# Patient Record
Sex: Female | Born: 1945 | Race: White | Hispanic: No | Marital: Single | Smoking: Former smoker
Health system: Southern US, Community
[De-identification: ages and names within clinical notes are randomized; demographics above are authoritative.]

## PROBLEM LIST (undated history)

## (undated) DIAGNOSIS — Z8489 Family history of other specified conditions: Secondary | ICD-10-CM

## (undated) DIAGNOSIS — J189 Pneumonia, unspecified organism: Secondary | ICD-10-CM

## (undated) DIAGNOSIS — J449 Chronic obstructive pulmonary disease, unspecified: Secondary | ICD-10-CM

## (undated) DIAGNOSIS — E7800 Pure hypercholesterolemia, unspecified: Secondary | ICD-10-CM

## (undated) DIAGNOSIS — R7303 Prediabetes: Secondary | ICD-10-CM

## (undated) DIAGNOSIS — M810 Age-related osteoporosis without current pathological fracture: Secondary | ICD-10-CM

## (undated) DIAGNOSIS — I700 Atherosclerosis of aorta: Secondary | ICD-10-CM

## (undated) HISTORY — DX: Chronic obstructive pulmonary disease, unspecified: J44.9

## (undated) HISTORY — PX: ABDOMINAL HYSTERECTOMY: SHX81

## (undated) HISTORY — PX: COLONOSCOPY: SHX174

## (undated) HISTORY — DX: Age-related osteoporosis without current pathological fracture: M81.0

---

## 1974-01-28 HISTORY — PX: ABDOMINAL HYSTERECTOMY: SHX81

## 2003-01-29 HISTORY — PX: OTHER SURGICAL HISTORY: SHX169

## 2004-07-09 ENCOUNTER — Ambulatory Visit: Payer: Self-pay | Admitting: General Practice

## 2004-12-12 ENCOUNTER — Ambulatory Visit: Payer: Self-pay | Admitting: Endocrinology

## 2005-02-21 ENCOUNTER — Ambulatory Visit: Payer: Self-pay | Admitting: Family Medicine

## 2005-07-09 ENCOUNTER — Ambulatory Visit: Payer: Self-pay | Admitting: General Practice

## 2005-11-01 ENCOUNTER — Ambulatory Visit: Payer: Self-pay | Admitting: Unknown Physician Specialty

## 2006-07-15 ENCOUNTER — Ambulatory Visit: Payer: Self-pay | Admitting: Endocrinology

## 2007-02-02 ENCOUNTER — Other Ambulatory Visit: Payer: Self-pay

## 2007-02-02 ENCOUNTER — Ambulatory Visit: Payer: Self-pay | Admitting: Internal Medicine

## 2007-09-09 ENCOUNTER — Ambulatory Visit: Payer: Self-pay | Admitting: Endocrinology

## 2009-01-28 HISTORY — PX: ORIF FOREARM FRACTURE: SHX2124

## 2011-01-29 LAB — COMPREHENSIVE METABOLIC PANEL
Albumin: 3.8 g/dL (ref 3.4–5.0)
Alkaline Phosphatase: 60 U/L (ref 50–136)
Anion Gap: 10 (ref 7–16)
BUN: 12 mg/dL (ref 7–18)
Bilirubin,Total: 0.3 mg/dL (ref 0.2–1.0)
Calcium, Total: 9.1 mg/dL (ref 8.5–10.1)
Co2: 29 mmol/L (ref 21–32)
Creatinine: 0.89 mg/dL (ref 0.60–1.30)
EGFR (African American): >60
Glucose: 108 mg/dL — ABNORMAL HIGH (ref 65–99)
Osmolality: 283 (ref 275–301)
Potassium: 4 mmol/L (ref 3.5–5.1)
Sodium: 142 mmol/L (ref 136–145)
Total Protein: 7.7 g/dL (ref 6.4–8.2)

## 2011-01-29 LAB — CBC
HGB: 14.8 g/dL (ref 12.0–16.0)
RBC: 4.61 10*6/uL (ref 3.80–5.20)
WBC: 6.8 10*3/uL (ref 3.6–11.0)

## 2011-01-30 ENCOUNTER — Inpatient Hospital Stay: Payer: Self-pay | Admitting: Internal Medicine

## 2011-01-30 LAB — RAPID INFLUENZA A&B ANTIGENS

## 2011-01-31 LAB — CBC WITH DIFFERENTIAL/PLATELET
Basophil #: 0 10*3/uL (ref 0.0–0.1)
Basophil %: 0.1 %
Eosinophil #: 0 10*3/uL (ref 0.0–0.7)
Eosinophil %: 0 %
HCT: 36.6 % (ref 35.0–47.0)
Lymphocyte %: 7.3 %
MCH: 31.6 pg (ref 26.0–34.0)
MCHC: 33.2 g/dL (ref 32.0–36.0)
MCV: 95 fL (ref 80–100)
Monocyte %: 6.2 %
Neutrophil #: 11.8 10*3/uL — ABNORMAL HIGH (ref 1.4–6.5)
Neutrophil %: 86.4 %
Platelet: 204 10*3/uL (ref 150–440)
RDW: 12.6 % (ref 11.5–14.5)
WBC: 13.6 10*3/uL — ABNORMAL HIGH (ref 3.6–11.0)

## 2011-01-31 LAB — BASIC METABOLIC PANEL
Anion Gap: 9 (ref 7–16)
BUN: 15 mg/dL (ref 7–18)
Chloride: 109 mmol/L — ABNORMAL HIGH (ref 98–107)
Creatinine: 0.64 mg/dL (ref 0.60–1.30)
EGFR (African American): >60
EGFR (Non-African Amer.): >60
Osmolality: 293 (ref 275–301)
Potassium: 4 mmol/L (ref 3.5–5.1)

## 2013-06-17 ENCOUNTER — Ambulatory Visit: Payer: Self-pay | Admitting: Emergency Medicine

## 2013-06-28 DIAGNOSIS — B370 Candidal stomatitis: Secondary | ICD-10-CM | POA: Insufficient documentation

## 2013-06-28 DIAGNOSIS — G479 Sleep disorder, unspecified: Secondary | ICD-10-CM | POA: Insufficient documentation

## 2013-12-10 ENCOUNTER — Ambulatory Visit: Payer: Self-pay

## 2014-04-18 DIAGNOSIS — M179 Osteoarthritis of knee, unspecified: Secondary | ICD-10-CM | POA: Diagnosis not present

## 2014-04-18 DIAGNOSIS — Z9289 Personal history of other medical treatment: Secondary | ICD-10-CM | POA: Diagnosis not present

## 2014-04-18 DIAGNOSIS — Z8051 Family history of malignant neoplasm of kidney: Secondary | ICD-10-CM | POA: Diagnosis not present

## 2014-04-18 DIAGNOSIS — Z23 Encounter for immunization: Secondary | ICD-10-CM | POA: Diagnosis not present

## 2014-04-18 DIAGNOSIS — Z8249 Family history of ischemic heart disease and other diseases of the circulatory system: Secondary | ICD-10-CM | POA: Diagnosis not present

## 2014-04-18 DIAGNOSIS — J449 Chronic obstructive pulmonary disease, unspecified: Secondary | ICD-10-CM | POA: Diagnosis not present

## 2014-05-12 ENCOUNTER — Encounter: Payer: Self-pay | Admitting: Family Medicine

## 2014-05-12 DIAGNOSIS — M1710 Unilateral primary osteoarthritis, unspecified knee: Secondary | ICD-10-CM | POA: Insufficient documentation

## 2014-05-12 DIAGNOSIS — Z8249 Family history of ischemic heart disease and other diseases of the circulatory system: Secondary | ICD-10-CM | POA: Insufficient documentation

## 2014-05-12 DIAGNOSIS — Z8051 Family history of malignant neoplasm of kidney: Secondary | ICD-10-CM | POA: Insufficient documentation

## 2014-05-12 DIAGNOSIS — M179 Osteoarthritis of knee, unspecified: Secondary | ICD-10-CM | POA: Insufficient documentation

## 2014-05-12 DIAGNOSIS — Z9889 Other specified postprocedural states: Secondary | ICD-10-CM | POA: Insufficient documentation

## 2014-05-12 DIAGNOSIS — J449 Chronic obstructive pulmonary disease, unspecified: Secondary | ICD-10-CM | POA: Insufficient documentation

## 2014-05-22 NOTE — Discharge Summary (Signed)
PATIENT NAME:  Lori Lambert, Lori Lambert MR#:  [id number] DATE OF BIRTH:  1945/07/04  DATE OF ADMISSION:  01/30/2011 DATE OF DISCHARGE:  02/01/2011  DIAGNOSES: 1. Possible chronic obstructive pulmonary disease. 2. Bronchitis. 3. Leukocytosis.  4. Hyperglycemia.  5. Smoking.   DISPOSITION: Patient is being discharged home.   FOLLOW UP: Follow up with primary care physician in 1 to 2 weeks after discharge.   DIET: Regular.   ACTIVITY: As tolerated.   NOTE: Patient is going to need outpatient pulmonary function testing.   DISCHARGE MEDICATIONS:  1. Symbicort 160/4.5, 2 puffs b.i.d.  2. Spiriva 18 mcg inhaled daily.  3. Tussionex 5 mL b.i.d. p.r.n. cough.  4. Levaquin 500 mg daily for five days.  5. Combivent metered dose inhaler 2 puffs q.6 hours p.r.n. shortness of breath. 6. Nicotine patch 21 mg daily.   LABORATORY, DIAGNOSTIC, AND RADIOLOGICAL DATA: Influenza A and B negative. Chest x-ray showed no evidence of any acute infiltrate or any cardiopulmonary pathology. CBC essentially normal. Complete metabolic panel normal other than mild hyperglycemia and hyponatremia.   HOSPITAL COURSE: Patient is a 69 year old female with past medical history of smoking who presented with upper respiratory infection symptoms, cough, shortness of breath. She was felt to have possible chronic obstructive pulmonary disease with bronchitis and was treated with nebulizer treatments, Symbicort, Spiriva and empiric antibiotics. Influenza A and B were checked which were negative. She had no infiltrate on her chest x-ray. With conservative management there was an improvement in patient's condition. She will require pulmonary function test as an outpatient. She had mild leukocytosis and this was felt to be due to steroids that she had received initially. She was counseled extensively about smoking. She is being discharged home in a stable condition and advised to follow up with her primary care physician in 1 to 2 weeks  after discharge and have outpatient PFTs.   TIME SPENT: 45 minutes.  ____________________________ Darrick MeigsSangeeta Tarsha Blando, MD sp:cms D: 02/01/2011 16:34:20 ET T: 02/04/2011 10:34:27 ET JOB#: [id number]  cc: Darrick MeigsSangeeta Tamie Minteer, MD, <Dictator> Darrick MeigsSANGEETA Smaran Gaus MD ELECTRONICALLY SIGNED 02/04/2011 12:16

## 2014-05-22 NOTE — H&P (Signed)
PATIENT NAME:  Lori Lambert, Lori Lambert MR#:  [id number] DATE OF BIRTH:  [birth year]  DATE OF ADMISSION:  01/30/2011  PRIMARY CARE PHYSICIAN: Dr. Mayford Knife    EMERGENCY ROOM PHYSICIAN: Dr. Dolores Frame   CHIEF COMPLAINT: Shortness of breath.   HISTORY OF PRESENT ILLNESS: The patient is a 69 year old female who presents with chief complaint of shortness of breath. Symptoms began three days ago. The patient has had associated wheezing, dry cough, low-grade fever. The patient felt like she was having the flu. The patient has a granddaughter who is also sick at home. The patient saw her primary care physician three days ago. She was put on ibuprofen and Zithromax. She has not had much relief.   ALLERGIES: Demerol and sulfa.   CURRENT MEDICATIONS:  1. Zithromax 250 mg p.o. daily. 2. Ibuprofen 200 mg p.o. q.4 hours.   PAST SURGICAL HISTORY: Colonoscopy.   SOCIAL HISTORY: The patient smokes 1 pack per day. She denies alcohol abuse or drug abuse. She is employed in Location manager for building boxes.    FAMILY HISTORY: The patient's father died at 76 years old, had an MI. Mother died in her 29's, had an MI.   REVIEW OF SYSTEMS: CONSTITUTIONAL: The patient denies any fevers, chills, night sweats. HEENT: The patient denies any hearing loss, dysphagia, visual problems. CARDIOVASCULAR: The patient denies any chest pain, orthopnea, or PND. RESPIRATORY: Please see history of present illness. GU: The patient denies any nausea, vomiting, abdominal pain, hematemesis, hematochezia, or melena. GU: The patient denies any hematuria, dysuria, or frequency. NEUROLOGIC: The patient denies any headache, focal weakness or seizures. SKIN: The patient denies lesions or rash. ENDOCRINE: The patient denies any polyuria, polyphagia, or polydipsia. MUSCULOSKELETAL: The patient denies any arthritis, joint effusion, or swelling. HEMATOLOGICAL: The patient denies any easy bleeding or bruises.   PHYSICAL EXAMINATION:   VITAL SIGNS:  Temperature 98.4, heart rate 91, respirations 22, blood pressure 105/71, oxygen sat 91%.   HEENT: Atraumatic, normocephalic. Pupils equal, round, and reactive to light and accommodation. Extraocular movements intact. Sclerae anicteric. Mucous membranes dry.   NECK: Supple. No organomegaly.   CARDIOVASCULAR: S1, S2, regular rate and rhythm. No gallops, no thrills, no murmurs.   RESPIRATORY: The patient has expiratory wheezes bilaterally. There is no rales or rhonchi.   GI: Abdomen is soft, nontender, nondistended. Normal bowel sounds. No hepatosplenomegaly.   GU: There is no hematuria or masses noted.   SKIN: No lesions, no rash.   ENDOCRINE: There are no masses. No thyromegaly.   LYMPH: No lymphadenopathy or nodes palpable.   NEUROLOGIC: Cranial nerves II through XII grossly intact. Motor strength is 5 out of 5 in bilateral upper and lower extremities. Sensation is within normal limits. No focal neurological deficits noted on examination.   MUSCULOSKELETAL: No arthritis, joint effusion, or swelling.   HEMATOLOGICAL: No ecchymosis, no bleeding, no petechiae noted.   EXTREMITIES: No cyanosis, no clubbing, no edema. 2+ pedal pulses bilaterally.   LABORATORY, DIAGNOSTIC, AND RADIOLOGICAL DATA: WBC count 6800, hemoglobin 14.6, hematocrit 43.7, platelet count 216, glucose 108, BUN 12, creatinine 0.89, sodium 142, potassium 4.0, chloride 103, CO2 29, calcium 9.1, total bilirubin 0.3, alkaline phosphatase 60, ALT 27, AST 29, total protein 7.7, albumin 3.8, estimated GFR is greater than 60. EKG sinus tachycardia, premature atrial complexes, 104 beats per minute.   ASSESSMENT AND PLAN:  1. The patient is a 69 year old female who presents with shortness of breath and wheezing due to COPD exacerbation. Admit to medical floor. Start COPD clinical  pathway orders. IV Solu-Medrol, nebs, Rocephin, and IV fluids.  2. Tobacco abuse. Smoking cessation. Place nicotine patch.  3. DVT prophylaxis. Lovenox.    ____________________________ Donia AstJignesh S. Kazimir Hartnett, MD jsp:drc D: 01/30/2011 03:22:30 ET T: 01/30/2011 10:00:22 ET JOB#: [id number]  cc: Donia AstJignesh S. Yaniel Limbaugh, MD, <Dictator> Donia AstJIGNESH S Jewell Haught MD ELECTRONICALLY SIGNED 01/30/2011 21:47

## 2014-05-23 DIAGNOSIS — F17200 Nicotine dependence, unspecified, uncomplicated: Secondary | ICD-10-CM | POA: Diagnosis not present

## 2014-05-23 DIAGNOSIS — Z23 Encounter for immunization: Secondary | ICD-10-CM | POA: Diagnosis not present

## 2014-05-23 DIAGNOSIS — J439 Emphysema, unspecified: Secondary | ICD-10-CM | POA: Diagnosis not present

## 2014-05-23 DIAGNOSIS — M40204 Unspecified kyphosis, thoracic region: Secondary | ICD-10-CM | POA: Diagnosis not present

## 2014-05-23 DIAGNOSIS — M179 Osteoarthritis of knee, unspecified: Secondary | ICD-10-CM | POA: Diagnosis not present

## 2015-12-13 ENCOUNTER — Ambulatory Visit: Payer: Self-pay | Admitting: Internal Medicine

## 2016-01-01 ENCOUNTER — Encounter: Payer: Self-pay | Admitting: Family Medicine

## 2016-01-01 ENCOUNTER — Ambulatory Visit (INDEPENDENT_AMBULATORY_CARE_PROVIDER_SITE_OTHER): Payer: BLUE CROSS/BLUE SHIELD | Admitting: Family Medicine

## 2016-01-01 VITALS — BP 128/78 | HR 73 | Resp 16 | Ht 62.0 in | Wt 121.0 lb

## 2016-01-01 DIAGNOSIS — J449 Chronic obstructive pulmonary disease, unspecified: Secondary | ICD-10-CM

## 2016-01-01 DIAGNOSIS — M170 Bilateral primary osteoarthritis of knee: Secondary | ICD-10-CM

## 2016-01-01 MED ORDER — BUDESONIDE-FORMOTEROL FUMARATE 160-4.5 MCG/ACT IN AERO: 2.0000 | INHALATION_SPRAY | Freq: Two times a day (BID) | RESPIRATORY_TRACT | 5 refills | Status: DC

## 2016-01-02 NOTE — Progress Notes (Signed)
Date:  01/01/2016   Name:  Lori Lambert   DOB:  01/09/1946   MRN:  [id number]  PCP:  Schuyler AmorWilliam Orlen Leedy, MD    Chief Complaint: COPD (refill symbicort )   History of Present Illness:  This is a 70 y.o. female with COPD here for Symbicort refill, uses one puff bid, also has Ventolin MDI but uses rarely. Some knee OA but otherwise healthy. No longer smoking, COPD sxs stable.  Review of Systems:  Review of Systems  Constitutional: Negative for chills and fever.  Respiratory: Negative for shortness of breath.   Cardiovascular: Negative for chest pain and leg swelling.    Patient Active Problem List   Diagnosis Date Noted  . CAFL (chronic airflow limitation) (HCC) 05/12/2014  . Family history of cancer of the kidney 05/12/2014  . Family history of cardiac disorder 05/12/2014  . H/O surgical procedure 05/12/2014  . Arthritis of knee, degenerative 05/12/2014    Prior to Admission medications   Medication Sig Start Date End Date Taking? Authorizing Provider  albuterol (PROVENTIL HFA;VENTOLIN HFA) 108 (90 BASE) MCG/ACT inhaler Inhale 2 puffs into the lungs every 4 (four) hours as needed.   Yes Historical Provider, MD  budesonide-formoterol (SYMBICORT) 160-4.5 MCG/ACT inhaler Inhale 2 puffs into the lungs 2 (two) times daily. 01/01/16  Yes Schuyler AmorWilliam Esaul Dorwart, MD    Allergies  Allergen Reactions  . Sulfa Antibiotics     History reviewed. No pertinent surgical history.  Social History  Substance Use Topics  . Smoking status: Former Games developermoker  . Smokeless tobacco: Never Used  . Alcohol use No    Family History  Problem Relation Age of Onset  . Heart attack Mother   . Heart attack Father   . Cancer Sister     Medication list has been reviewed and updated.  Physical Examination: BP 128/78   Pulse 73   Resp 16   Ht 5\' 2"  (1.575 m)   Wt 121 lb (54.9 kg)   SpO2 98%   BMI 22.13 kg/m   Physical Exam  Constitutional: She is oriented to person, place, and time. She appears  well-developed and well-nourished.  Cardiovascular: Normal rate, regular rhythm and normal heart sounds.   Pulmonary/Chest: Effort normal and breath sounds normal.  Musculoskeletal: She exhibits no edema.  Neurological: She is alert and oriented to person, place, and time.  Skin: Skin is warm and dry.  Psychiatric: She has a normal mood and affect. Her behavior is normal.  Nursing note and vitals reviewed.   Assessment and Plan:  1. Chronic obstructive pulmonary disease, unspecified COPD type (HCC) Stable, refill Symbicort, schedule wellness visit  2. Primary osteoarthritis of both knees Tylenol prn   Return in about 3 months (around 03/31/2016).  Dionne AnoWilliam M. Kingsley SpittlePlonk, Jr. MD [facility]  01/02/2016

## 2016-02-21 ENCOUNTER — Ambulatory Visit (INDEPENDENT_AMBULATORY_CARE_PROVIDER_SITE_OTHER): Payer: BLUE CROSS/BLUE SHIELD | Admitting: Family Medicine

## 2016-02-21 ENCOUNTER — Encounter: Payer: Self-pay | Admitting: Family Medicine

## 2016-02-21 VITALS — BP 108/78 | HR 67 | Temp 97.7°F | Resp 16 | Ht 62.0 in | Wt 128.8 lb

## 2016-02-21 DIAGNOSIS — Z23 Encounter for immunization: Secondary | ICD-10-CM | POA: Diagnosis not present

## 2016-02-21 DIAGNOSIS — Z8249 Family history of ischemic heart disease and other diseases of the circulatory system: Secondary | ICD-10-CM

## 2016-02-21 DIAGNOSIS — J449 Chronic obstructive pulmonary disease, unspecified: Secondary | ICD-10-CM | POA: Diagnosis not present

## 2016-02-21 DIAGNOSIS — J40 Bronchitis, not specified as acute or chronic: Secondary | ICD-10-CM

## 2016-02-21 DIAGNOSIS — Z0000 Encounter for general adult medical examination without abnormal findings: Secondary | ICD-10-CM | POA: Diagnosis not present

## 2016-02-21 DIAGNOSIS — Z1231 Encounter for screening mammogram for malignant neoplasm of breast: Secondary | ICD-10-CM

## 2016-02-21 DIAGNOSIS — Z8051 Family history of malignant neoplasm of kidney: Secondary | ICD-10-CM

## 2016-02-21 DIAGNOSIS — M170 Bilateral primary osteoarthritis of knee: Secondary | ICD-10-CM

## 2016-02-21 DIAGNOSIS — Z1239 Encounter for other screening for malignant neoplasm of breast: Secondary | ICD-10-CM

## 2016-02-21 MED ORDER — AZITHROMYCIN 250 MG PO TABS: ORAL_TABLET | ORAL | 0 refills | Status: DC

## 2016-02-21 MED ORDER — ACETAMINOPHEN 500 MG PO TABS: 500.0000 mg | ORAL_TABLET | Freq: Four times a day (QID) | ORAL | 0 refills | Status: DC | PRN

## 2016-02-21 MED ORDER — ZOSTER VACCINE LIVE 19400 UNT/0.65ML ~~LOC~~ SUSR: 0.6500 mL | Freq: Once | SUBCUTANEOUS | 0 refills | Status: AC

## 2016-02-21 NOTE — Patient Instructions (Signed)
Avoid Aleve, try Tylenol extra strength instead.

## 2016-02-21 NOTE — Progress Notes (Signed)
Date:  02/21/2016   Name:  Lori Lambert   DOB:  1945/06/17   MRN:  [id number]  PCP:  Schuyler Amor, MD    Chief Complaint: Annual Exam and Cough (hx breathing problems may need rx to catch early. started Sat felt dizzy all day and then went into cough Monday. Causes difficult breathing at times. )   History of Present Illness:  This is a 71 y.o. female seen in six week f/u. 4 days ago developed dizziness/fever then cough prod clear phlegm, fever resolved but cough getting worse, having more wheezing, rhinorrhea, Delsym qhs not helping. Using Symbicort bid, sometimes more, seldom uses albuterol. Taking Aleve prn for OA pain, helps. Father, mother, and brother died MI, sister died kidney ca. No smoking x 3 yrs. Has had flu imm and Pneumovax but not Prevnar or Zostrix. Tdap last year. Mammo last 2014, colonoscopy 5 yrs ago no sign findings per pt  Review of Systems:  Review of Systems  Constitutional: Negative for chills and fatigue.  HENT: Negative for ear pain and sore throat.   Eyes: Negative for pain.  Respiratory: Negative for choking and stridor.   Cardiovascular: Negative for chest pain and leg swelling.  Gastrointestinal: Negative for abdominal pain, constipation and diarrhea.  Endocrine: Negative for polydipsia and polyuria.  Genitourinary: Negative for dysuria.  Musculoskeletal: Negative for myalgias.  Neurological: Negative for tremors, syncope and light-headedness.  Hematological: Negative for adenopathy.  Psychiatric/Behavioral: Negative for agitation and confusion.    Patient Active Problem List   Diagnosis Date Noted  . CAFL (chronic airflow limitation) (HCC) 05/12/2014  . Family history of cancer of the kidney 05/12/2014  . Family history of cardiac disorder 05/12/2014  . H/O surgical procedure 05/12/2014  . Arthritis of knee, degenerative 05/12/2014    Prior to Admission medications   Medication Sig Start Date End Date Taking? Authorizing Provider  albuterol  (PROVENTIL HFA;VENTOLIN HFA) 108 (90 BASE) MCG/ACT inhaler Inhale 2 puffs into the lungs every 4 (four) hours as needed.   Yes Historical Provider, MD  budesonide-formoterol (SYMBICORT) 160-4.5 MCG/ACT inhaler Inhale 2 puffs into the lungs 2 (two) times daily. 01/01/16  Yes Schuyler Amor, MD  azithromycin (ZITHROMAX) 250 MG tablet Take two tablets today then one tablet daily for four days 02/21/16   Schuyler Amor, MD  Zoster Vaccine Live, PF, (ZOSTAVAX) 78295 UNT/0.65ML injection Inject 19,400 Units into the skin once. 02/21/16 02/21/16  Schuyler Amor, MD    Allergies  Allergen Reactions  . Sulfa Antibiotics     History reviewed. No pertinent surgical history.  Social History  Substance Use Topics  . Smoking status: Former Games developer  . Smokeless tobacco: Never Used  . Alcohol use No    Family History  Problem Relation Age of Onset  . Heart attack Mother   . Heart attack Father   . Cancer Sister     Medication list has been reviewed and updated.  Physical Examination: BP 108/78   Pulse 67   Temp 97.7 F (36.5 C) (Other (Comment))   Resp 16   Ht 5\' 2"  (1.575 m)   Wt 128 lb 12.8 oz (58.4 kg)   SpO2 95%   BMI 23.56 kg/m   Physical Exam  Constitutional: She is oriented to person, place, and time. She appears well-developed and well-nourished.  HENT:  Head: Normocephalic and atraumatic.  Right Ear: External ear normal.  Left Ear: External ear normal.  Nose: Nose normal.  Mouth/Throat: Oropharynx is clear and moist.  TM's clear  Eyes: Conjunctivae and EOM are normal. Pupils are equal, round, and reactive to light. No scleral icterus.  Neck: Neck supple. No thyromegaly present.  Cardiovascular: Normal rate, regular rhythm, normal heart sounds and intact distal pulses.   Pulmonary/Chest: Effort normal. She has no rales.  Exp wheezes RUL  Abdominal: Soft. She exhibits no distension and no mass. There is no tenderness.  Musculoskeletal: She exhibits no edema.  Lymphadenopathy:     She has no cervical adenopathy.  Neurological: She is alert and oriented to person, place, and time. Coordination normal.  Romberg negative, gait normal  Skin: Skin is warm and dry.  Psychiatric: She has a normal mood and affect. Her behavior is normal.  Nursing note and vitals reviewed.   Assessment and Plan:  1. Bronchitis Zpak as directed  2. Chronic obstructive pulmonary disease, unspecified COPD type (HCC) Discussed regular use of Symbicort and prn use of albuterol for sx control - Comprehensive Metabolic Panel (CMET) - CBC  3. Primary osteoarthritis of both knees Advised avoid Aleve prn given age, use Tylenol ES instead  4. Family history of cancer of the kidney  5. Strong family history of heart disease - Lipid Profile  6. Breast cancer screening - MM Digital Screening; Future  7. Healthcare maintenance Prevnar today Zostavax ordered - TSH - Vitamin D (25 hydroxy)  Return in about 6 months (around 08/20/2016).  Dionne AnoWilliam M. Kingsley SpittlePlonk, Jr. MD [facility]  02/21/2016

## 2016-02-22 ENCOUNTER — Other Ambulatory Visit: Payer: Self-pay | Admitting: Family Medicine

## 2016-02-22 DIAGNOSIS — E559 Vitamin D deficiency, unspecified: Secondary | ICD-10-CM | POA: Insufficient documentation

## 2016-02-22 LAB — COMPREHENSIVE METABOLIC PANEL
ALBUMIN: 4.1 g/dL (ref 3.5–4.8)
ALK PHOS: 70 IU/L (ref 39–117)
ALT: 21 IU/L (ref 0–32)
AST: 25 IU/L (ref 0–40)
Albumin/Globulin Ratio: 1.9 (ref 1.2–2.2)
BUN / CREAT RATIO: 12 (ref 12–28)
BUN: 9 mg/dL (ref 8–27)
Bilirubin Total: <0.2 mg/dL (ref 0.0–1.2)
CO2: 27 mmol/L (ref 18–29)
CREATININE: 0.75 mg/dL (ref 0.57–1.00)
Calcium: 9 mg/dL (ref 8.7–10.3)
Chloride: 101 mmol/L (ref 96–106)
GFR calc Af Amer: 93 mL/min/{1.73_m2} (ref >59)
GFR calc non Af Amer: 81 mL/min/{1.73_m2} (ref >59)
GLUCOSE: 110 mg/dL — AB (ref 65–99)
Globulin, Total: 2.2 g/dL (ref 1.5–4.5)
Potassium: 4.2 mmol/L (ref 3.5–5.2)
Sodium: 146 mmol/L — ABNORMAL HIGH (ref 134–144)
TOTAL PROTEIN: 6.3 g/dL (ref 6.0–8.5)

## 2016-02-22 LAB — CBC
Hematocrit: 41.1 % (ref 34.0–46.6)
Hemoglobin: 13.5 g/dL (ref 11.1–15.9)
MCH: 31.3 pg (ref 26.6–33.0)
MCHC: 32.8 g/dL (ref 31.5–35.7)
MCV: 95 fL (ref 79–97)
Platelets: 292 10*3/uL (ref 150–379)
RBC: 4.32 x10E6/uL (ref 3.77–5.28)
RDW: 13.2 % (ref 12.3–15.4)
WBC: 6.1 10*3/uL (ref 3.4–10.8)

## 2016-02-22 LAB — TSH: TSH: 1.8 u[IU]/mL (ref 0.450–4.500)

## 2016-02-22 LAB — LIPID PANEL
CHOLESTEROL TOTAL: 151 mg/dL (ref 100–199)
Chol/HDL Ratio: 3.9 ratio units (ref 0.0–4.4)
HDL: 39 mg/dL — ABNORMAL LOW (ref >39)
LDL Calculated: 89 mg/dL (ref 0–99)
Triglycerides: 114 mg/dL (ref 0–149)
VLDL Cholesterol Cal: 23 mg/dL (ref 5–40)

## 2016-02-22 LAB — VITAMIN D 25 HYDROXY (VIT D DEFICIENCY, FRACTURES): VIT D 25 HYDROXY: 10.5 ng/mL — AB (ref 30.0–100.0)

## 2016-02-22 MED ORDER — VITAMIN D 50 MCG (2000 UT) PO CAPS: 1.0000 | ORAL_CAPSULE | Freq: Every day | ORAL | Status: DC

## 2016-02-22 NOTE — Progress Notes (Signed)
vita

## 2016-06-17 ENCOUNTER — Telehealth: Payer: Self-pay

## 2016-06-17 NOTE — Telephone Encounter (Signed)
Left Message. Need to get patient in with Plonk so we can order Mammo and Colonoscopy. Per Quality Metric. Please state in schedule that we need to order Mammo and colo for this patient.  [facility]

## 2016-08-21 ENCOUNTER — Encounter: Payer: Self-pay | Admitting: Family Medicine

## 2016-08-21 ENCOUNTER — Ambulatory Visit (INDEPENDENT_AMBULATORY_CARE_PROVIDER_SITE_OTHER): Payer: BLUE CROSS/BLUE SHIELD | Admitting: Family Medicine

## 2016-08-21 VITALS — BP 118/78 | HR 74 | Resp 16 | Ht 62.0 in | Wt 128.4 lb

## 2016-08-21 DIAGNOSIS — E559 Vitamin D deficiency, unspecified: Secondary | ICD-10-CM

## 2016-08-21 DIAGNOSIS — Z0000 Encounter for general adult medical examination without abnormal findings: Secondary | ICD-10-CM

## 2016-08-21 DIAGNOSIS — Z1211 Encounter for screening for malignant neoplasm of colon: Secondary | ICD-10-CM | POA: Diagnosis not present

## 2016-08-21 DIAGNOSIS — J449 Chronic obstructive pulmonary disease, unspecified: Secondary | ICD-10-CM | POA: Diagnosis not present

## 2016-08-21 DIAGNOSIS — Z1239 Encounter for other screening for malignant neoplasm of breast: Secondary | ICD-10-CM

## 2016-08-21 DIAGNOSIS — M170 Bilateral primary osteoarthritis of knee: Secondary | ICD-10-CM | POA: Diagnosis not present

## 2016-08-21 DIAGNOSIS — B070 Plantar wart: Secondary | ICD-10-CM

## 2016-08-21 DIAGNOSIS — Z1231 Encounter for screening mammogram for malignant neoplasm of breast: Secondary | ICD-10-CM

## 2016-08-21 MED ORDER — BUDESONIDE-FORMOTEROL FUMARATE 160-4.5 MCG/ACT IN AERO: 2.0000 | INHALATION_SPRAY | Freq: Two times a day (BID) | RESPIRATORY_TRACT | 3 refills | Status: DC

## 2016-08-21 MED ORDER — ACETAMINOPHEN 500 MG PO TABS: 1000.0000 mg | ORAL_TABLET | Freq: Three times a day (TID) | ORAL | 0 refills | Status: DC | PRN

## 2016-08-21 NOTE — Progress Notes (Signed)
Date:  08/21/2016   Name:  Lori Lambert   DOB:  12/20/1945   MRN:  [id number]  PCP:  Schuyler AmorPlonk, Alita Waldren, MD    Chief Complaint: COPD (refill symbicort )   History of Present Illness:  This is a 71 y.o. female seen for 6 month f/u. Bronchitis sxs resolved. Using Symbicort bid and albuterol rarely. OA sxs well controlled on Aleve. Taking vit D supp. C/o painful lesion R plantar foot.  Review of Systems:  Review of Systems  Constitutional: Negative for chills and fever.  Respiratory: Negative for cough.   Cardiovascular: Negative for chest pain and leg swelling.  Neurological: Negative for syncope and light-headedness.    Patient Active Problem List   Diagnosis Date Noted  . Vitamin D deficiency 02/22/2016  . CAFL (chronic airflow limitation) (HCC) 05/12/2014  . Family history of cancer of the kidney 05/12/2014  . Family history of cardiac disorder 05/12/2014  . Arthritis of knee, degenerative 05/12/2014    Prior to Admission medications   Medication Sig Start Date End Date Taking? Authorizing Provider  acetaminophen (TYLENOL) 500 MG tablet Take 2 tablets (1,000 mg total) by mouth every 8 (eight) hours as needed. 08/21/16  Yes Cacey Willow, Chrissie NoaWilliam, MD  albuterol (PROVENTIL HFA;VENTOLIN HFA) 108 (90 BASE) MCG/ACT inhaler Inhale 2 puffs into the lungs every 4 (four) hours as needed.   Yes [provider]  budesonide-formoterol (SYMBICORT) 160-4.5 MCG/ACT inhaler Inhale 2 puffs into the lungs 2 (two) times daily. 08/21/16  Yes Florene Brill, Chrissie NoaWilliam, MD  Cholecalciferol (VITAMIN D) 2000 units CAPS Take 1 capsule (2,000 Units total) by mouth daily. 02/22/16  Yes Lanier Millon, Chrissie NoaWilliam, MD    Allergies  Allergen Reactions  . Sulfa Antibiotics     History reviewed. No pertinent surgical history.  Social History  Substance Use Topics  . Smoking status: Former Games developermoker  . Smokeless tobacco: Never Used  . Alcohol use No    Family History  Problem Relation Age of Onset  . Heart attack Mother    . Heart attack Father   . Cancer Sister     Medication list has been reviewed and updated.  Physical Examination: BP 118/78   Pulse 74   Resp 16   Ht 5\' 2"  (1.575 m)   Wt 128 lb 6.4 oz (58.2 kg)   SpO2 95%   BMI 23.48 kg/m   Physical Exam  Constitutional: She appears well-developed and well-nourished.  Cardiovascular: Normal rate, regular rhythm and normal heart sounds.   Pulmonary/Chest: Effort normal and breath sounds normal.  Musculoskeletal: She exhibits no edema.  Neurological: She is alert.  Skin: Skin is warm and dry.  Plantar wart R midfoot  Psychiatric: She has a normal mood and affect. Her behavior is normal.  Nursing note and vitals reviewed.   Assessment and Plan:  1. CAFL (chronic airflow limitation) (HCC) Stable, refill Symbicort  2. Primary osteoarthritis of both knees Change Aleve to Tylenol 1000 mg q8h prn  3. Plantar wart of right foot Offered cryo, will cushion and see if resolves  4. Vitamin D deficiency On supplement - Vitamin D (25 hydroxy)  5. Breast cancer screening - MM Digital Diagnostic Bilat; Future  6. Colon cancer screening - Ambulatory referral to Gastroenterology  7. Healthcare maintenance - B12 - Hepatitis C Antibody  Return in about 6 months (around 02/21/2017).  Dionne AnoWilliam M. Kingsley SpittlePlonk, Jr. MD [facility]  08/21/2016

## 2016-08-22 ENCOUNTER — Other Ambulatory Visit: Payer: Self-pay | Admitting: Family Medicine

## 2016-08-22 LAB — VITAMIN B12: VITAMIN B 12: 341 pg/mL (ref 232–1245)

## 2016-08-22 LAB — HEPATITIS C ANTIBODY: Hep C Virus Ab: 0.1 s/co ratio (ref 0.0–0.9)

## 2016-08-22 LAB — VITAMIN D 25 HYDROXY (VIT D DEFICIENCY, FRACTURES): Vit D, 25-Hydroxy: 16.6 ng/mL — ABNORMAL LOW (ref 30.0–100.0)

## 2016-08-22 MED ORDER — VITAMIN D3 125 MCG (5000 UT) PO CAPS: 1.0000 | ORAL_CAPSULE | Freq: Every day | ORAL | Status: DC

## 2017-04-17 ENCOUNTER — Encounter: Payer: Self-pay | Admitting: Family Medicine

## 2017-04-17 ENCOUNTER — Ambulatory Visit (INDEPENDENT_AMBULATORY_CARE_PROVIDER_SITE_OTHER): Payer: BLUE CROSS/BLUE SHIELD | Admitting: Family Medicine

## 2017-04-17 VITALS — BP 117/78 | HR 78 | Resp 16 | Ht 62.0 in | Wt 127.6 lb

## 2017-04-17 DIAGNOSIS — R739 Hyperglycemia, unspecified: Secondary | ICD-10-CM | POA: Diagnosis not present

## 2017-04-17 DIAGNOSIS — M170 Bilateral primary osteoarthritis of knee: Secondary | ICD-10-CM

## 2017-04-17 DIAGNOSIS — E559 Vitamin D deficiency, unspecified: Secondary | ICD-10-CM | POA: Diagnosis not present

## 2017-04-17 DIAGNOSIS — J449 Chronic obstructive pulmonary disease, unspecified: Secondary | ICD-10-CM | POA: Diagnosis not present

## 2017-04-17 DIAGNOSIS — J302 Other seasonal allergic rhinitis: Secondary | ICD-10-CM | POA: Diagnosis not present

## 2017-04-17 DIAGNOSIS — J309 Allergic rhinitis, unspecified: Secondary | ICD-10-CM | POA: Insufficient documentation

## 2017-04-17 MED ORDER — BUDESONIDE-FORMOTEROL FUMARATE 160-4.5 MCG/ACT IN AERO: 2.0000 | INHALATION_SPRAY | Freq: Two times a day (BID) | RESPIRATORY_TRACT | 3 refills | Status: DC

## 2017-04-17 NOTE — Progress Notes (Signed)
Date:  04/17/2017   Name:  Lori Lambert   DOB:  02/25/1945   MRN:  [id number]  PCP:  Schuyler Amor, MD    Chief Complaint: Annual Exam   History of Present Illness:  This is a 72 y.o. female seen for 8 month f/u. COPD stable on Symbicort, needs refill. Needs PE form for work completed. OA sxs well controlled on Tylenol prn, off Aleve. R foot plantar wart resolved. Taking vit D supplement. Never got mammogram or colonoscopy. Blood work shows hyperglycemia. C/o sinus congestion and rhinorrhea lately, happens seasonally.  Review of Systems:  Review of Systems  Constitutional: Negative for chills and fever.  HENT: Negative for ear pain and sore throat.   Respiratory: Negative for cough and shortness of breath.   Cardiovascular: Negative for chest pain and leg swelling.  Genitourinary: Negative for difficulty urinating.  Neurological: Negative for syncope and light-headedness.    Patient Active Problem List   Diagnosis Date Noted  . Allergic rhinitis 04/17/2017  . Vitamin D deficiency 02/22/2016  . COPD (chronic obstructive pulmonary disease) (HCC) 05/12/2014  . Family history of cancer of the kidney 05/12/2014  . Family history of cardiac disorder 05/12/2014  . Arthritis of knee, degenerative 05/12/2014    Prior to Admission medications   Medication Sig Start Date End Date Taking? Authorizing Provider  albuterol (PROVENTIL HFA;VENTOLIN HFA) 108 (90 BASE) MCG/ACT inhaler Inhale 2 puffs into the lungs every 4 (four) hours as needed.   Yes [provider]  budesonide-formoterol (SYMBICORT) 160-4.5 MCG/ACT inhaler Inhale 2 puffs into the lungs 2 (two) times daily. 04/17/17  Yes Naina Sleeper, Chrissie Noa, MD  Cholecalciferol (VITAMIN D3) 5000 units TABS Take by mouth.   Yes [provider]    Allergies  Allergen Reactions  . Sulfa Antibiotics     History reviewed. No pertinent surgical history.  Social History   Tobacco Use  . Smoking status: Former Games developer  .  Smokeless tobacco: Never Used  Substance Use Topics  . Alcohol use: No  . Drug use: No    Family History  Problem Relation Age of Onset  . Heart attack Mother   . Heart attack Father   . Cancer Sister     Medication list has been reviewed and updated.  Physical Examination: BP 117/78   Pulse 78   Resp 16   Ht 5\' 2"  (1.575 m)   Wt 127 lb 9.6 oz (57.9 kg)   SpO2 97%   BMI 23.34 kg/m   Physical Exam  Constitutional: She is oriented to person, place, and time. She appears well-developed and well-nourished.  HENT:  Head: Normocephalic and atraumatic.  Right Ear: External ear normal.  Left Ear: External ear normal.  Mouth/Throat: Oropharynx is clear and moist.  TMs clear  Eyes: Pupils are equal, round, and reactive to light. Conjunctivae and EOM are normal.  Neck: Neck supple. No thyromegaly present.  Cardiovascular: Normal rate, regular rhythm and normal heart sounds.  Pulmonary/Chest: Effort normal and breath sounds normal.  Abdominal: Soft. She exhibits no distension and no mass. There is no tenderness.  Musculoskeletal: She exhibits no edema.  Normal spine flexion  Lymphadenopathy:    She has no cervical adenopathy.  Neurological: She is alert and oriented to person, place, and time. Coordination normal.  Romberg neg, gait normal  Skin: Skin is warm and dry.  Psychiatric: She has a normal mood and affect. Her behavior is normal.  Nursing note and vitals reviewed.   Assessment and Plan:  1. Chronic obstructive pulmonary disease, unspecified COPD type (HCC) Well controlled, refill Symbicort, cont albuterol MDI prn  2. Seasonal allergic rhinitis, unspecified trigger Trial OTC Claritin or Zyrtec  3. Primary osteoarthritis of both knees Well controlled on prn Tylenol  4. Vitamin D deficiency On increased supplement - Vitamin D (25 hydroxy)  5. Hyperglycemia - HgB A1c  6. HM Proceed with mammogram, colonoscopy (ordered last visit) PE form for work  completed.  Return in about 6 months (around 10/18/2017).  Dionne AnoWilliam M. Kingsley SpittlePlonk, Jr. MD [facility]  04/17/2017

## 2017-04-18 LAB — HEMOGLOBIN A1C
Est. average glucose Bld gHb Est-mCnc: 123 mg/dL
HEMOGLOBIN A1C: 5.9 % — AB (ref 4.8–5.6)

## 2017-04-18 LAB — VITAMIN D 25 HYDROXY (VIT D DEFICIENCY, FRACTURES): VIT D 25 HYDROXY: 57.5 ng/mL (ref 30.0–100.0)

## 2017-04-22 ENCOUNTER — Other Ambulatory Visit: Payer: Self-pay | Admitting: Family Medicine

## 2017-04-22 DIAGNOSIS — Z1239 Encounter for other screening for malignant neoplasm of breast: Secondary | ICD-10-CM

## 2017-07-17 ENCOUNTER — Telehealth: Payer: Self-pay | Admitting: Internal Medicine

## 2017-07-17 NOTE — Telephone Encounter (Signed)
Called to schedule Medicare Annual Wellness Visit with Nurse Health Advisor. If patient returns call, please schedule AWV with NHA any date  °Thank you! °For any questions please contact: °Kathryn Brown [phone]  °Or Skype me at: [email or web address]  ° ° °

## 2017-08-18 ENCOUNTER — Telehealth: Payer: Self-pay | Admitting: Internal Medicine

## 2017-08-18 NOTE — Telephone Encounter (Signed)
Called to schedule Medicare Annual Wellness Visit with Nurse Health Advisor °Thank you! °For any questions please contact: °Kathryn Brown [phone]  °Or Skype me at: [email or web address]  ° ° °

## 2017-10-20 ENCOUNTER — Ambulatory Visit: Payer: Medicare HMO | Admitting: Family Medicine

## 2017-10-20 ENCOUNTER — Ambulatory Visit (INDEPENDENT_AMBULATORY_CARE_PROVIDER_SITE_OTHER): Payer: BLUE CROSS/BLUE SHIELD | Admitting: Internal Medicine

## 2017-10-20 ENCOUNTER — Encounter: Payer: Self-pay | Admitting: Internal Medicine

## 2017-10-20 VITALS — BP 124/82 | HR 71 | Ht 62.0 in | Wt 126.0 lb

## 2017-10-20 DIAGNOSIS — J449 Chronic obstructive pulmonary disease, unspecified: Secondary | ICD-10-CM

## 2017-10-20 DIAGNOSIS — F17201 Nicotine dependence, unspecified, in remission: Secondary | ICD-10-CM | POA: Insufficient documentation

## 2017-10-20 DIAGNOSIS — R7303 Prediabetes: Secondary | ICD-10-CM | POA: Insufficient documentation

## 2017-10-20 DIAGNOSIS — Z23 Encounter for immunization: Secondary | ICD-10-CM

## 2017-10-20 DIAGNOSIS — E2839 Other primary ovarian failure: Secondary | ICD-10-CM | POA: Diagnosis not present

## 2017-10-20 MED ORDER — BUDESONIDE-FORMOTEROL FUMARATE 160-4.5 MCG/ACT IN AERO: 2.0000 | INHALATION_SPRAY | Freq: Two times a day (BID) | RESPIRATORY_TRACT | 3 refills | Status: DC

## 2017-10-20 NOTE — Progress Notes (Signed)
Date:  10/20/2017   Name:  Lori Lambert   DOB:  [birth year]   MRN:  [id number]   Chief Complaint: Establish Care; Hyperglycemia; and Immunizations (high dose flu shot.)  Hyperglycemia  This is a chronic problem. The problem occurs intermittently. The problem has been unchanged. Pertinent negatives include no abdominal pain, arthralgias, chest pain, coughing, fatigue, fever, headaches, nausea or numbness.  She has not lost any weight, she did cut out sodas.  COPD - hx of smoking.  Now using Symbicort daily and albuterol prn. She quit smoking 2 years ago, after smoking a ppd for 40 years. She is interested in the screening lung CT.  Hx of arm fracture - she has never had a DEXA.  Will try to get that scheduled along with mammogram which is due.  CRC screening - she has had a colonoscopy in the past 5-10 years at [facility].  The report is not available so will try to get that to determine next exam.  Lab Results  Component Value Date   HGBA1C 5.9 (H) 04/17/2017    Review of Systems  Constitutional: Negative for appetite change, fatigue, fever and unexpected weight change.  HENT: Negative for tinnitus and trouble swallowing.   Eyes: Negative for visual disturbance.  Respiratory: Positive for shortness of breath (with extreme exertion only). Negative for cough, chest tightness and wheezing.   Cardiovascular: Negative for chest pain, palpitations and leg swelling.  Gastrointestinal: Negative for abdominal pain, diarrhea and nausea.  Genitourinary: Negative for dysuria and hematuria.  Musculoskeletal: Negative for arthralgias.  Allergic/Immunologic: Negative for environmental allergies.  Neurological: Negative for dizziness, tremors, numbness and headaches.  Psychiatric/Behavioral: Negative for dysphoric mood and sleep disturbance.    Patient Active Problem List   Diagnosis Date Noted  . Allergic rhinitis 04/17/2017  . Vitamin D deficiency 02/22/2016  . COPD (chronic obstructive  pulmonary disease) (HCC) 05/12/2014  . Family history of cancer of the kidney 05/12/2014  . Family history of cardiac disorder 05/12/2014  . Arthritis of knee, degenerative 05/12/2014    Allergies  Allergen Reactions  . Sulfa Antibiotics     History reviewed. No pertinent surgical history.  Social History   Tobacco Use  . Smoking status: Former Games developer  . Smokeless tobacco: Never Used  Substance Use Topics  . Alcohol use: No  . Drug use: No     Medication list has been reviewed and updated.  Current Meds  Medication Sig  . albuterol (PROVENTIL HFA;VENTOLIN HFA) 108 (90 BASE) MCG/ACT inhaler Inhale 2 puffs into the lungs every 4 (four) hours as needed.  . budesonide-formoterol (SYMBICORT) 160-4.5 MCG/ACT inhaler Inhale 2 puffs into the lungs 2 (two) times daily.  . Cholecalciferol (VITAMIN D3) 5000 units TABS Take by mouth.  . [DISCONTINUED] budesonide-formoterol (SYMBICORT) 160-4.5 MCG/ACT inhaler Inhale 2 puffs into the lungs 2 (two) times daily.    PHQ 2/9 Scores 04/17/2017 02/21/2016 01/01/2016  PHQ - 2 Score 0 0 0  PHQ- 9 Score 0 - -    Physical Exam  Constitutional: She is oriented to person, place, and time. She appears well-developed. No distress.  HENT:  Head: Normocephalic and atraumatic.  Neck: Normal range of motion. Neck supple.  Cardiovascular: Normal rate, regular rhythm and normal heart sounds.  Pulmonary/Chest: Effort normal and breath sounds normal. No respiratory distress.  Abdominal: Soft. Bowel sounds are normal. She exhibits no distension. There is no tenderness.  Musculoskeletal: Normal range of motion. She exhibits no edema or tenderness.  Lymphadenopathy:  She has no cervical adenopathy.  Neurological: She is alert and oriented to person, place, and time.  Skin: Skin is warm and dry. No rash noted.  Psychiatric: She has a normal mood and affect. Her behavior is normal. Thought content normal.  Nursing note and vitals reviewed.   BP 124/82  (BP Location: Right Arm, Patient Position: Sitting, Cuff Size: Normal)   Pulse 71   Ht 5\' 2"  (1.575 m)   Wt 126 lb (57.2 kg)   SpO2 97%   BMI 23.05 kg/m   Assessment and Plan: 1. Ovarian failure Schedule along with mammogram - DG Bone Density; Future  2. Chronic obstructive pulmonary disease, unspecified COPD type (HCC) Will refer to Screening lung CT - Comprehensive metabolic panel - TSH  3. Prediabetes Continue to limit concentrated sweets - Hemoglobin A1c  3. Prediabetes Continue no concentrated sweets - Hemoglobin A1c  4. Tobacco use disorder, moderate, in sustained remission Refer for low dose screening CT  5. Need for influenza vaccination - Flu vaccine HIGH DOSE PF (Fluzone High dose)   Partially dictated using Animal nutritionistDragon software. Any errors are unintentional.  Bari EdwardLaura Praveen Coia, MD [facility]  10/20/2017

## 2017-10-21 LAB — COMPREHENSIVE METABOLIC PANEL
ALT: 15 IU/L (ref 0–32)
AST: 19 IU/L (ref 0–40)
Albumin/Globulin Ratio: 1.9 (ref 1.2–2.2)
Albumin: 4.3 g/dL (ref 3.5–4.8)
Alkaline Phosphatase: 76 IU/L (ref 39–117)
BUN/Creatinine Ratio: 12 (ref 12–28)
BUN: 11 mg/dL (ref 8–27)
CHLORIDE: 106 mmol/L (ref 96–106)
CO2: 25 mmol/L (ref 20–29)
Calcium: 9.6 mg/dL (ref 8.7–10.3)
Creatinine, Ser: 0.91 mg/dL (ref 0.57–1.00)
GFR calc non Af Amer: 64 mL/min/{1.73_m2} (ref >59)
GFR, EST AFRICAN AMERICAN: 73 mL/min/{1.73_m2} (ref >59)
Globulin, Total: 2.3 g/dL (ref 1.5–4.5)
Glucose: 88 mg/dL (ref 65–99)
Potassium: 4.9 mmol/L (ref 3.5–5.2)
Sodium: 147 mmol/L — ABNORMAL HIGH (ref 134–144)
TOTAL PROTEIN: 6.6 g/dL (ref 6.0–8.5)

## 2017-10-21 LAB — TSH: TSH: 2.88 u[IU]/mL (ref 0.450–4.500)

## 2017-10-21 LAB — HEMOGLOBIN A1C
Est. average glucose Bld gHb Est-mCnc: 126 mg/dL
Hgb A1c MFr Bld: 6 % — ABNORMAL HIGH (ref 4.8–5.6)

## 2017-10-22 ENCOUNTER — Telehealth: Payer: Self-pay | Admitting: *Deleted

## 2017-10-22 NOTE — Telephone Encounter (Signed)
Received referral for low dose lung cancer screening CT scan. Message left at phone number listed in EMR for patient to call me back to facilitate scheduling scan.  

## 2017-10-27 ENCOUNTER — Telehealth: Payer: Self-pay | Admitting: *Deleted

## 2017-10-27 DIAGNOSIS — Z87891 Personal history of nicotine dependence: Secondary | ICD-10-CM

## 2017-10-27 DIAGNOSIS — Z122 Encounter for screening for malignant neoplasm of respiratory organs: Secondary | ICD-10-CM

## 2017-10-27 NOTE — Telephone Encounter (Signed)
Received referral for initial lung cancer screening scan. Contacted patient and obtained smoking history,(former, quit 07/13/14, 40 pack year) as well as answering questions related to screening process. Patient denies signs of lung cancer such as weight loss or hemoptysis. Patient denies comorbidity that would prevent curative treatment if lung cancer were found. Patient is scheduled for shared decision making visit and CT scan on 11/18/17 @ 2pm.

## 2017-10-27 NOTE — Telephone Encounter (Signed)
Received referral for low dose lung cancer screening CT scan. Message left at phone number listed in EMR for patient to call me back to facilitate scheduling scan.  

## 2017-11-10 ENCOUNTER — Inpatient Hospital Stay: Admission: RE | Admit: 2017-11-10 | Payer: Medicare HMO | Source: Ambulatory Visit

## 2017-11-17 ENCOUNTER — Encounter: Payer: Self-pay | Admitting: Nurse Practitioner

## 2017-11-17 ENCOUNTER — Telehealth: Payer: Self-pay | Admitting: *Deleted

## 2017-11-17 NOTE — Telephone Encounter (Signed)
Called pt to remind them of appt for ldct screening on 11-18-17 @ 1400.  Voiced understanding.

## 2017-11-18 ENCOUNTER — Ambulatory Visit
Admission: RE | Admit: 2017-11-18 | Discharge: 2017-11-18 | Disposition: A | Discharge status: Home / Self Care | Payer: BLUE CROSS/BLUE SHIELD | Source: Ambulatory Visit | Attending: Oncology | Admitting: Oncology

## 2017-11-18 ENCOUNTER — Inpatient Hospital Stay: Payer: Medicare HMO | Attending: Oncology | Admitting: Nurse Practitioner

## 2017-11-18 DIAGNOSIS — Z87891 Personal history of nicotine dependence: Secondary | ICD-10-CM | POA: Diagnosis not present

## 2017-11-18 DIAGNOSIS — I2510 Atherosclerotic heart disease of native coronary artery without angina pectoris: Secondary | ICD-10-CM | POA: Insufficient documentation

## 2017-11-18 DIAGNOSIS — I700 Atherosclerosis of aorta: Secondary | ICD-10-CM | POA: Diagnosis not present

## 2017-11-18 DIAGNOSIS — J432 Centrilobular emphysema: Secondary | ICD-10-CM | POA: Insufficient documentation

## 2017-11-18 DIAGNOSIS — Z122 Encounter for screening for malignant neoplasm of respiratory organs: Secondary | ICD-10-CM | POA: Diagnosis not present

## 2017-11-18 NOTE — Progress Notes (Signed)
In accordance with CMS guidelines, patient has met eligibility criteria including age, absence of signs or symptoms of lung cancer.  Social History   Tobacco Use  . Smoking status: Former Smoker    Packs/day: 1.00    Years: 40.00    Pack years: 40.00    Types: Cigarettes    Last attempt to quit: 07/13/2014    Years since quitting: 3.3  . Smokeless tobacco: Never Used  Substance Use Topics  . Alcohol use: No  . Drug use: No      A shared decision-making session was conducted prior to the performance of CT scan. This includes one or more decision aids, includes benefits and harms of screening, follow-up diagnostic testing, over-diagnosis, false positive rate, and total radiation exposure.   Counseling on the importance of adherence to annual lung cancer LDCT screening, impact of co-morbidities, and ability or willingness to undergo diagnosis and treatment is imperative for compliance of the program.   Counseling on the importance of continued smoking cessation for former smokers; the importance of smoking cessation for current smokers, and information about tobacco cessation interventions have been given to patient including [place] and [place] quit  programs.   Written order for lung cancer screening with LDCT has been given to the patient and any and all questions have been answered to the best of my abilities.    Yearly follow up will be coordinated by Burgess Estelle, Thoracic Navigator.  Beckey Rutter, DNP, AGNP-C [place] at [facility] [phone] (work cell) [phone] (office) 11/18/17 2:42 PM

## 2017-11-19 ENCOUNTER — Encounter: Payer: Self-pay | Admitting: *Deleted

## 2018-04-17 ENCOUNTER — Other Ambulatory Visit: Payer: Self-pay

## 2018-04-17 ENCOUNTER — Ambulatory Visit (INDEPENDENT_AMBULATORY_CARE_PROVIDER_SITE_OTHER): Payer: BLUE CROSS/BLUE SHIELD | Admitting: Internal Medicine

## 2018-04-17 ENCOUNTER — Encounter: Payer: Self-pay | Admitting: Internal Medicine

## 2018-04-17 VITALS — BP 104/76 | HR 76 | Ht 62.0 in | Wt 127.0 lb

## 2018-04-17 DIAGNOSIS — F17201 Nicotine dependence, unspecified, in remission: Secondary | ICD-10-CM | POA: Diagnosis not present

## 2018-04-17 DIAGNOSIS — M170 Bilateral primary osteoarthritis of knee: Secondary | ICD-10-CM

## 2018-04-17 DIAGNOSIS — Z13820 Encounter for screening for osteoporosis: Secondary | ICD-10-CM | POA: Diagnosis not present

## 2018-04-17 DIAGNOSIS — Z1231 Encounter for screening mammogram for malignant neoplasm of breast: Secondary | ICD-10-CM

## 2018-04-17 DIAGNOSIS — J449 Chronic obstructive pulmonary disease, unspecified: Secondary | ICD-10-CM

## 2018-04-17 DIAGNOSIS — M62830 Muscle spasm of back: Secondary | ICD-10-CM

## 2018-04-17 DIAGNOSIS — Z0000 Encounter for general adult medical examination without abnormal findings: Secondary | ICD-10-CM | POA: Diagnosis not present

## 2018-04-17 DIAGNOSIS — Z1211 Encounter for screening for malignant neoplasm of colon: Secondary | ICD-10-CM | POA: Diagnosis not present

## 2018-04-17 DIAGNOSIS — R7303 Prediabetes: Secondary | ICD-10-CM | POA: Diagnosis not present

## 2018-04-17 LAB — POCT URINALYSIS DIPSTICK
BILIRUBIN UA: NEGATIVE
Glucose, UA: NEGATIVE
KETONES UA: NEGATIVE
Leukocytes, UA: NEGATIVE
Nitrite, UA: NEGATIVE
PH UA: 8 (ref 5.0–8.0)
Protein, UA: NEGATIVE
SPEC GRAV UA: 1.015 (ref 1.010–1.025)
UROBILINOGEN UA: 0.2 U/dL

## 2018-04-17 MED ORDER — ALBUTEROL SULFATE HFA 108 (90 BASE) MCG/ACT IN AERS: 2.0000 | INHALATION_SPRAY | RESPIRATORY_TRACT | 1 refills | Status: DC | PRN

## 2018-04-17 MED ORDER — BACLOFEN 10 MG PO TABS: 10.0000 mg | ORAL_TABLET | Freq: Every evening | ORAL | 0 refills | Status: DC | PRN

## 2018-04-17 NOTE — Progress Notes (Signed)
Date:  04/17/2018   Name:  Lori Lambert   DOB:  [birth year]   MRN:  [id number]   Chief Complaint: Annual Exam (Breast Exam. ) and Back Pain (In the last week she has been having shoot pains in mid right back. Sometimes she cannot get her urine to come out, even thouogh she feels like she has to go,.Been drinking cranberry juice to help. ) Lori Lambert is a 73 y.o. female who presents today for her Complete Annual Exam. She feels fairly well. She reports exercising walking. She reports she is sleeping fairly well.  Dexa ordered last year but never scheduled.  Last mammogram: 2014 Last Tdap: 2016 Last colonoscopy: 2008 PPV-23 2013 Prevnar 13 2018  Back Pain  This is a new problem. The current episode started in the past 7 days. The pain is present in the lumbar spine. The quality of the pain is described as shooting. The pain does not radiate. The pain is mild. Pertinent negatives include no abdominal pain, chest pain, dysuria, fever or headaches.  Urinary Frequency   Associated symptoms include frequency. Pertinent negatives include no chills or vomiting.  COPD - on daily symbicort.  Has not resumed smoking.  No recent infections. Knee OA - she takes 2 advil every morning and this allows to continue to work.  She has had injections in the left knee at [facility] in the past.  Does not think she needs another one at this time.  Review of Systems  Constitutional: Negative for chills, fatigue and fever.  HENT: Negative for congestion, hearing loss, tinnitus, trouble swallowing and voice change.   Eyes: Negative for visual disturbance.  Respiratory: Positive for shortness of breath. Negative for cough, chest tightness and wheezing.   Cardiovascular: Negative for chest pain, palpitations and leg swelling.  Gastrointestinal: Negative for abdominal pain, constipation, diarrhea and vomiting.  Endocrine: Negative for polydipsia and polyuria.  Genitourinary: Positive for frequency. Negative  for dysuria, genital sores, vaginal bleeding and vaginal discharge.  Musculoskeletal: Positive for back pain. Negative for arthralgias, gait problem and joint swelling.  Skin: Negative for color change and rash.  Allergic/Immunologic: Negative for environmental allergies.  Neurological: Negative for dizziness, tremors, light-headedness and headaches.  Hematological: Negative for adenopathy. Does not bruise/bleed easily.  Psychiatric/Behavioral: Negative for dysphoric mood and sleep disturbance. The patient is not nervous/anxious.     Patient Active Problem List   Diagnosis Date Noted  . Prediabetes 10/20/2017  . Ovarian failure 10/20/2017  . Tobacco use disorder, moderate, in sustained remission 10/20/2017  . Allergic rhinitis 04/17/2017  . Vitamin D deficiency 02/22/2016  . COPD (chronic obstructive pulmonary disease) (HCC) 05/12/2014  . Family history of cancer of the kidney 05/12/2014  . Family history of cardiac disorder 05/12/2014  . Arthritis of knee, degenerative 05/12/2014    Allergies  Allergen Reactions  . Sulfa Antibiotics     Past Surgical History:  Procedure Laterality Date  . bladder tack  2005  . ORIF FOREARM FRACTURE Right 2011    Social History   Tobacco Use  . Smoking status: Former Smoker    Packs/day: 1.00    Years: 40.00    Pack years: 40.00    Types: Cigarettes    Last attempt to quit: 07/13/2014    Years since quitting: 3.7  . Smokeless tobacco: Never Used  Substance Use Topics  . Alcohol use: No  . Drug use: No     Medication list has been reviewed and updated.  Current  Meds  Medication Sig  . albuterol (PROVENTIL HFA;VENTOLIN HFA) 108 (90 BASE) MCG/ACT inhaler Inhale 2 puffs into the lungs every 4 (four) hours as needed.  . budesonide-formoterol (SYMBICORT) 160-4.5 MCG/ACT inhaler Inhale 2 puffs into the lungs 2 (two) times daily.  . Cholecalciferol (VITAMIN D3) 5000 units TABS Take by mouth.    PHQ 2/9 Scores 04/17/2018 04/17/2017  02/21/2016 01/01/2016  PHQ - 2 Score 0 0 0 0  PHQ- 9 Score - 0 - -    Physical Exam Vitals signs and nursing note reviewed.  Constitutional:      General: She is not in acute distress.    Appearance: She is well-developed.  HENT:     Head: Normocephalic and atraumatic.     Right Ear: Tympanic membrane and ear canal normal.     Left Ear: Tympanic membrane and ear canal normal.     Nose:     Right Sinus: No maxillary sinus tenderness.     Left Sinus: No maxillary sinus tenderness.     Mouth/Throat:     Pharynx: Uvula midline.  Eyes:     General: No scleral icterus.       Right eye: No discharge.        Left eye: No discharge.     Conjunctiva/sclera: Conjunctivae normal.  Neck:     Musculoskeletal: Normal range of motion. No erythema.     Thyroid: No thyromegaly.     Vascular: No carotid bruit.  Cardiovascular:     Rate and Rhythm: Normal rate and regular rhythm.     Pulses: Normal pulses.     Heart sounds: Normal heart sounds.  Pulmonary:     Effort: Pulmonary effort is normal. No respiratory distress.     Breath sounds: No wheezing.  Chest:     Breasts:        Right: No mass, nipple discharge, skin change or tenderness.        Left: No mass, nipple discharge, skin change or tenderness.  Abdominal:     General: Bowel sounds are normal.     Palpations: Abdomen is soft.     Tenderness: There is no abdominal tenderness.  Musculoskeletal: Normal range of motion.       Arms:  Lymphadenopathy:     Cervical: No cervical adenopathy.  Skin:    General: Skin is warm and dry.     Findings: No rash.  Neurological:     Mental Status: She is alert and oriented to person, place, and time.     Cranial Nerves: No cranial nerve deficit.     Sensory: No sensory deficit.     Deep Tendon Reflexes: Reflexes are normal and symmetric.  Psychiatric:        Attention and Perception: Attention and perception normal.        Mood and Affect: Mood normal.        Speech: Speech normal.         Behavior: Behavior normal.        Thought Content: Thought content normal.        Cognition and Memory: Cognition normal.     Wt Readings from Last 3 Encounters:  04/17/18 127 lb (57.6 kg)  11/18/17 126 lb (57.2 kg)  10/20/17 126 lb (57.2 kg)    BP 104/76   Pulse 76   Ht  (1.575 m)   Wt 127 lb (57.6 kg)   SpO2 94%   BMI 23.23 kg/m   Assessment and Plan: 1.  Annual physical exam Suspect she passed a small stone - if UTI sx occur, call for antibiotics - Lipid panel - TSH - POCT urinalysis dipstick  2. Encounter for screening mammogram for breast cancer To be scheduled  3. Chronic obstructive pulmonary disease, unspecified COPD type (HCC) Doing well on daily symbicort - CBC with Differential/Platelet - albuterol (PROVENTIL HFA;VENTOLIN HFA) 108 (90 Base) MCG/ACT inhaler; Inhale 2 puffs into the lungs every 4 (four) hours as needed.  Dispense: 1 each; Refill: 1  4. Prediabetes Continue health diet and exercise - Comprehensive metabolic panel - Hemoglobin A1c  5. Tobacco use disorder, moderate, in sustained remission Remains free of tobacco use   6. Colon cancer screening - Fecal occult blood, imunochemical  7. Encounter for screening for osteoporosis - MM 3D SCREEN BREAST BILATERAL; Future  8. Primary osteoarthritis of both knees Continue low dose Advil as needed  9. Muscle spasm of back Use heat or ice; Advil PRN - baclofen (LIORESAL) 10 MG tablet; Take 1 tablet (10 mg total) by mouth at bedtime as needed for muscle spasms.  Dispense: 30 each; Refill: 0   Partially dictated using Animal nutritionist. Any errors are unintentional.  Bari Edward, MD [facility]  04/17/2018

## 2018-04-18 LAB — CBC WITH DIFFERENTIAL/PLATELET
BASOS ABS: 0.1 10*3/uL (ref 0.0–0.2)
Basos: 1 %
EOS (ABSOLUTE): 0.3 10*3/uL (ref 0.0–0.4)
Eos: 3 %
Hematocrit: 42.1 % (ref 34.0–46.6)
Hemoglobin: 14.5 g/dL (ref 11.1–15.9)
IMMATURE GRANULOCYTES: 0 %
Immature Grans (Abs): 0 10*3/uL (ref 0.0–0.1)
LYMPHS ABS: 3 10*3/uL (ref 0.7–3.1)
Lymphs: 36 %
MCH: 32.2 pg (ref 26.6–33.0)
MCHC: 34.4 g/dL (ref 31.5–35.7)
MCV: 93 fL (ref 79–97)
MONOS ABS: 0.6 10*3/uL (ref 0.1–0.9)
Monocytes: 7 %
NEUTROS PCT: 53 %
Neutrophils Absolute: 4.4 10*3/uL (ref 1.4–7.0)
PLATELETS: 327 10*3/uL (ref 150–450)
RBC: 4.51 x10E6/uL (ref 3.77–5.28)
RDW: 12.7 % (ref 11.7–15.4)
WBC: 8.4 10*3/uL (ref 3.4–10.8)

## 2018-04-18 LAB — TSH: TSH: 1.61 u[IU]/mL (ref 0.450–4.500)

## 2018-04-18 LAB — COMPREHENSIVE METABOLIC PANEL
ALK PHOS: 84 IU/L (ref 39–117)
ALT: 13 IU/L (ref 0–32)
AST: 20 IU/L (ref 0–40)
Albumin/Globulin Ratio: 1.9 (ref 1.2–2.2)
Albumin: 4.5 g/dL (ref 3.7–4.7)
BUN/Creatinine Ratio: 15 (ref 12–28)
BUN: 13 mg/dL (ref 8–27)
Bilirubin Total: 0.3 mg/dL (ref 0.0–1.2)
CALCIUM: 9.6 mg/dL (ref 8.7–10.3)
CO2: 26 mmol/L (ref 20–29)
CREATININE: 0.85 mg/dL (ref 0.57–1.00)
Chloride: 99 mmol/L (ref 96–106)
GFR calc Af Amer: 79 mL/min/{1.73_m2} (ref >59)
GFR, EST NON AFRICAN AMERICAN: 69 mL/min/{1.73_m2} (ref >59)
GLOBULIN, TOTAL: 2.4 g/dL (ref 1.5–4.5)
Glucose: 99 mg/dL (ref 65–99)
POTASSIUM: 4.1 mmol/L (ref 3.5–5.2)
SODIUM: 139 mmol/L (ref 134–144)
Total Protein: 6.9 g/dL (ref 6.0–8.5)

## 2018-04-18 LAB — HEMOGLOBIN A1C
Est. average glucose Bld gHb Est-mCnc: 123 mg/dL
Hgb A1c MFr Bld: 5.9 % — ABNORMAL HIGH (ref 4.8–5.6)

## 2018-04-18 LAB — LIPID PANEL
CHOL/HDL RATIO: 3.6 ratio (ref 0.0–4.4)
CHOLESTEROL TOTAL: 196 mg/dL (ref 100–199)
HDL: 54 mg/dL (ref >39)
LDL CALC: 122 mg/dL — AB (ref 0–99)
TRIGLYCERIDES: 102 mg/dL (ref 0–149)
VLDL Cholesterol Cal: 20 mg/dL (ref 5–40)

## 2018-04-23 LAB — FECAL OCCULT BLOOD, IMMUNOCHEMICAL: Fecal Occult Bld: NEGATIVE

## 2018-04-23 LAB — SPECIMEN STATUS REPORT

## 2018-08-13 ENCOUNTER — Telehealth: Payer: Self-pay | Admitting: Internal Medicine

## 2018-08-13 NOTE — Telephone Encounter (Signed)
Patient wants a letter to be cleared from wearing her mask at work due to COPD.

## 2018-08-14 NOTE — Telephone Encounter (Signed)
Called and spoke with patient. Informed we are not giving anyone a letter to get out of wearing a mask. Unfortunately, its mandated by the state and we all have to wear them.  She said she does not want a letter to not wear the mask, but to only cover her mouth and not her nose. Told patient to be protected she needs to wear the mask over her nose AND mouth because this is the proper way to be worn.   The patient abruptly hung up without replying to me.

## 2018-08-14 NOTE — Telephone Encounter (Signed)
ERROR

## 2018-09-29 ENCOUNTER — Ambulatory Visit: Payer: Medicare HMO | Admitting: Internal Medicine

## 2018-09-29 NOTE — Progress Notes (Deleted)
    Date:  05/01/100   Name:  Lori Lambert   DOB:  08-Feb-1945   MRN:  [id number]   Chief Complaint: No chief complaint on file.  Diabetes She presents for her follow-up diabetic visit. Diabetes type: pre-diabetes. Her disease course has been stable.  COPD -   Lab Results  Component Value Date   CREATININE 0.85 04/17/2018   BUN 13 04/17/2018   NA 139 04/17/2018   K 4.1 04/17/2018   CL 99 04/17/2018   CO2 26 04/17/2018   Lab Results  Component Value Date   HGBA1C 5.9 (H) 04/17/2018     Review of Systems  Patient Active Problem List   Diagnosis Date Noted  . Prediabetes 10/20/2017  . Ovarian failure 10/20/2017  . Tobacco use disorder, moderate, in sustained remission 10/20/2017  . Allergic rhinitis 04/17/2017  . Vitamin D deficiency 02/22/2016  . COPD (chronic obstructive pulmonary disease) ([place]) 05/12/2014  . Family history of cancer of the kidney 05/12/2014  . Family history of cardiac disorder 05/12/2014  . Arthritis of knee, degenerative 05/12/2014    Allergies  Allergen Reactions  . Sulfa Antibiotics     Past Surgical History:  Procedure Laterality Date  . bladder tack  2005  . ORIF FOREARM FRACTURE Right 2011    Social History   Tobacco Use  . Smoking status: Former Smoker    Packs/day: 1.00    Years: 40.00    Pack years: 40.00    Types: Cigarettes    Quit date: 07/13/2014    Years since quitting: 4.2  . Smokeless tobacco: Never Used  Substance Use Topics  . Alcohol use: No  . Drug use: No     Medication list has been reviewed and updated.  No outpatient medications have been marked as taking for the 09/29/18 encounter (Appointment) with Glean Hess, MD.    [facility] 2/9 Scores 04/17/2018 04/17/2017 02/21/2016 01/01/2016  PHQ - 2 Score 0 0 0 0  PHQ- 9 Score - 0 - -    BP Readings from Last 3 Encounters:  04/17/18 104/76  10/20/17 124/82  04/17/17 117/78    Physical Exam  Wt Readings from Last 3 Encounters:  04/17/18 127 lb (57.6  kg)  11/18/17 126 lb (57.2 kg)  10/20/17 126 lb (57.2 kg)    There were no vitals taken for this visit.  Assessment and Plan:

## 2018-11-13 ENCOUNTER — Telehealth: Payer: Self-pay

## 2018-11-13 NOTE — Telephone Encounter (Signed)
Left voicemail for pt to inform her that it is time for her annual lung cancer screening. Instructed pt to call back to confirm information prior to CT scan being scheduled. 

## 2018-11-15 ENCOUNTER — Other Ambulatory Visit: Payer: Self-pay | Admitting: Internal Medicine

## 2018-11-15 DIAGNOSIS — J449 Chronic obstructive pulmonary disease, unspecified: Secondary | ICD-10-CM

## 2018-12-25 ENCOUNTER — Telehealth: Payer: Self-pay | Admitting: *Deleted

## 2018-12-25 DIAGNOSIS — Z122 Encounter for screening for malignant neoplasm of respiratory organs: Secondary | ICD-10-CM

## 2018-12-25 DIAGNOSIS — Z87891 Personal history of nicotine dependence: Secondary | ICD-10-CM

## 2018-12-25 NOTE — Telephone Encounter (Signed)
Patient has been notified that lung cancer screening CT scan is due currently. Confirmed that patient is within the appropriate age range, and asymptomatic, (no signs or symptoms of lung cancer). Patient denies illness that would prevent curative treatment for lung cancer if found. Verified smoking history. She is a former smoker since 2016.  Patient is agreeable for CT scan being scheduled but would like for it to be in January of 2021. She does not have a preference on what day of the week the CT scan is scheduled.

## 2019-01-01 NOTE — Addendum Note (Signed)
Addended by: Lieutenant Diego on: 01/01/2019 11:26 AM   Modules accepted: Orders

## 2019-01-01 NOTE — Telephone Encounter (Signed)
Smoking history former, quit 07/13/14, 40 pack year

## 2019-02-02 ENCOUNTER — Ambulatory Visit: Admission: RE | Admit: 2019-02-02 | Payer: BC Managed Care – PPO | Source: Ambulatory Visit

## 2019-02-13 IMAGING — CT CT CHEST LUNG CANCER SCREENING LOW DOSE W/O CM
2 of 5 series · 15 of 40 positions shown, 18 images · non-contrast
Comparison: None.

CLINICAL DATA: Former smoker, quit 3 years ago, 40 pack-year
history.

EXAM:
CT CHEST WITHOUT CONTRAST LOW-DOSE FOR LUNG CANCER SCREENING
TECHNIQUE: Multidetector CT imaging of the chest was performed following the
standard protocol without IV contrast.

[Series 3: lung · axial · 0.59mm/px · z∈[-1203,-932]mm · 12 of 301 slices shown, 15 images (1 of 2)]
[im 15/301  mediastinal]
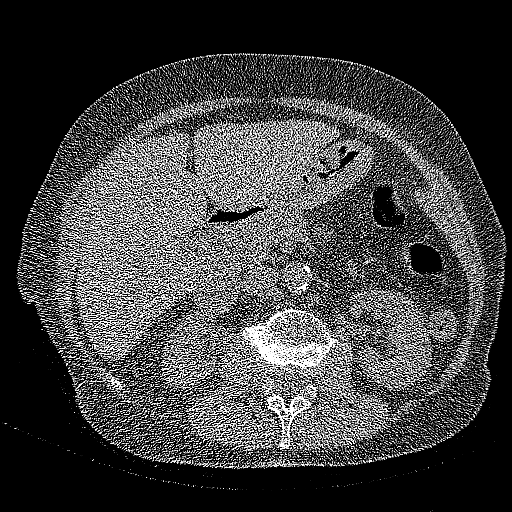
[im 15/301  lung]
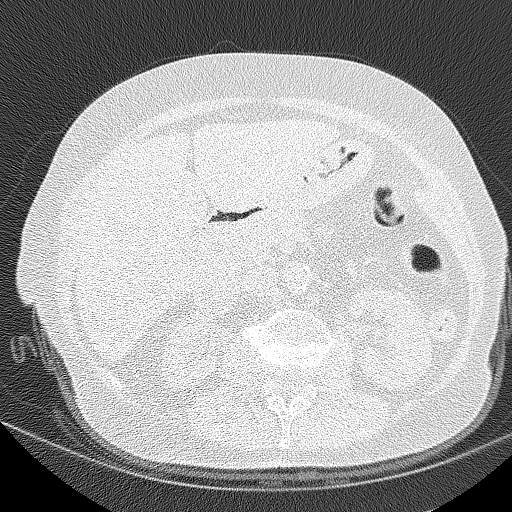
[im 43/301  lung]
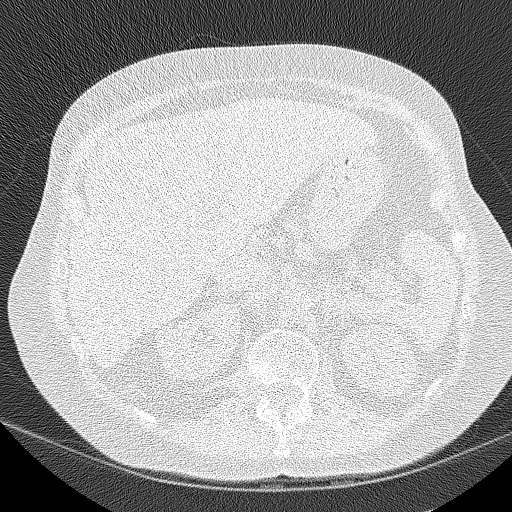
[im 72/301  lung]
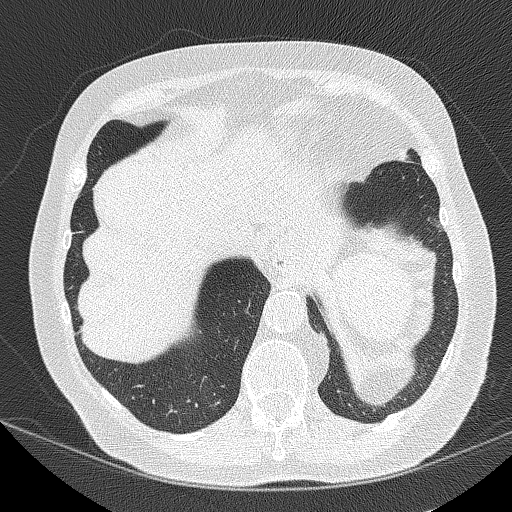
[im 86/301  lung]
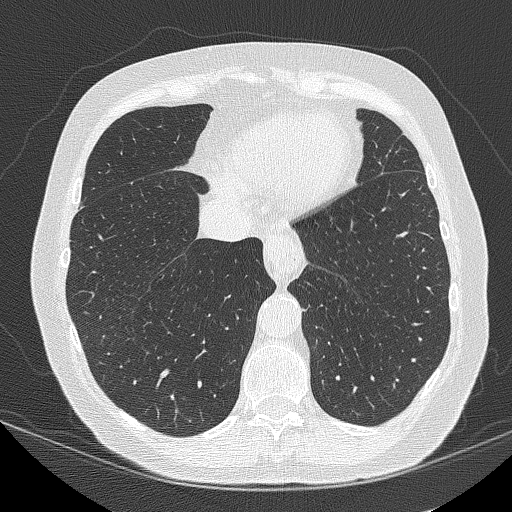
[im 115/301  mediastinal]
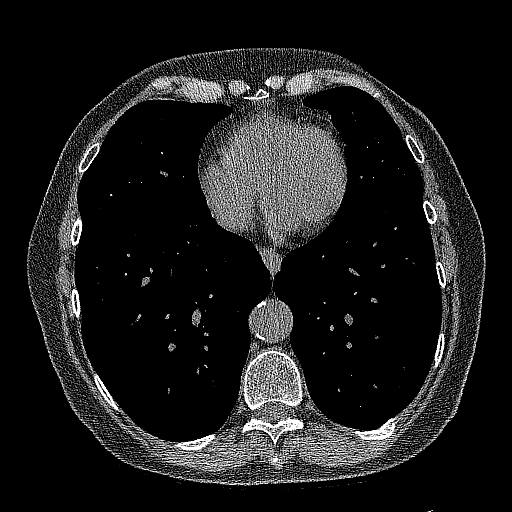
[im 115/301  lung]
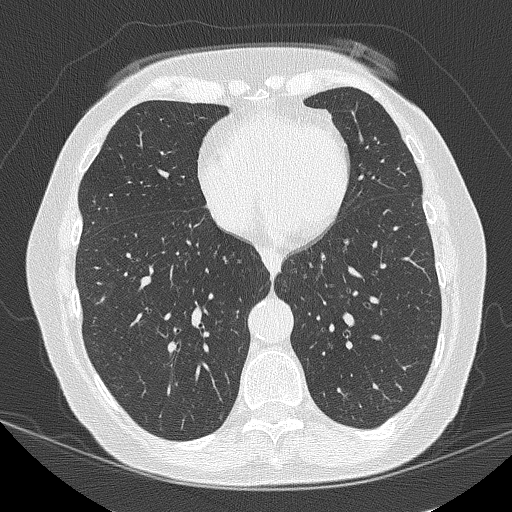
[im 143/301  lung]
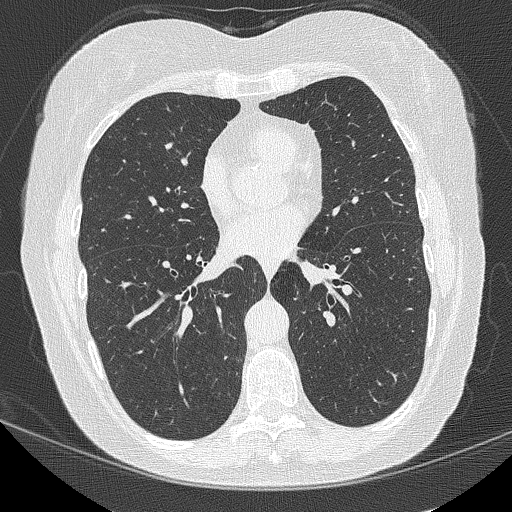
[im 158/301  lung]
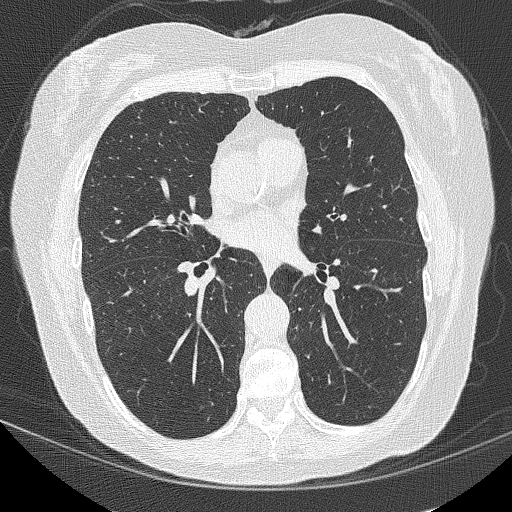
[im 186/301  lung]
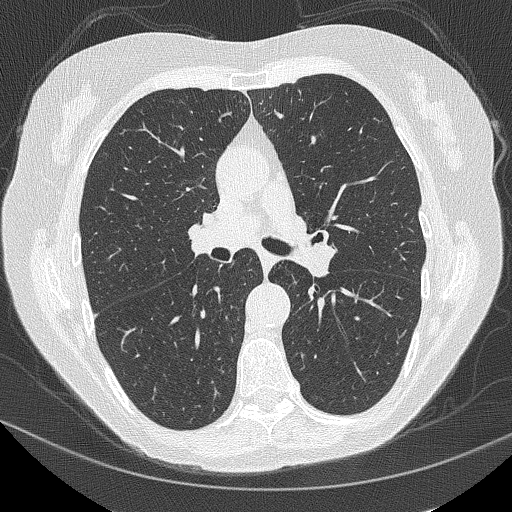
[im 215/301  mediastinal]
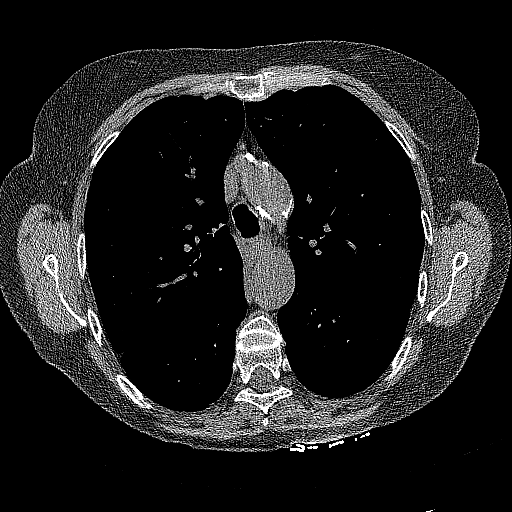
[im 215/301  lung]
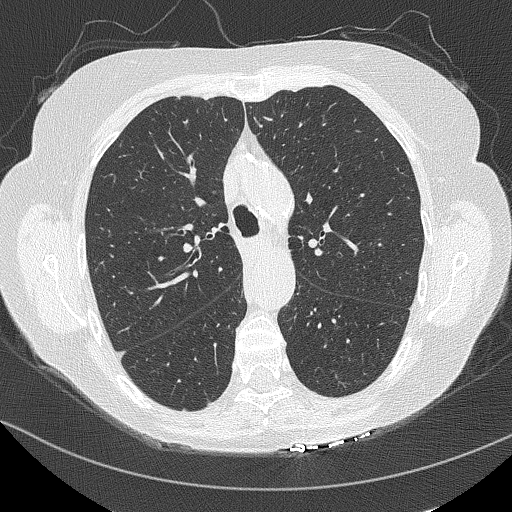
[im 229/301  lung]
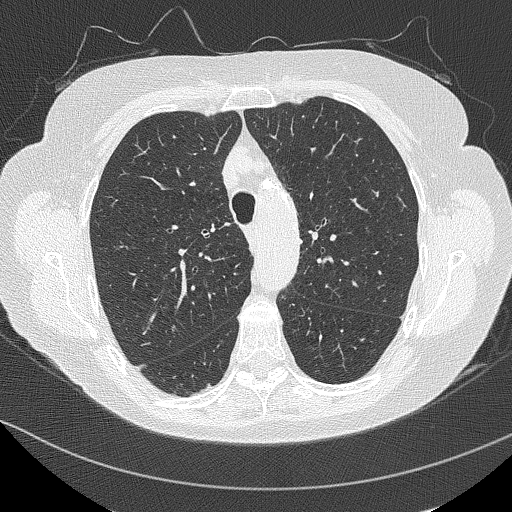
[im 258/301  lung]
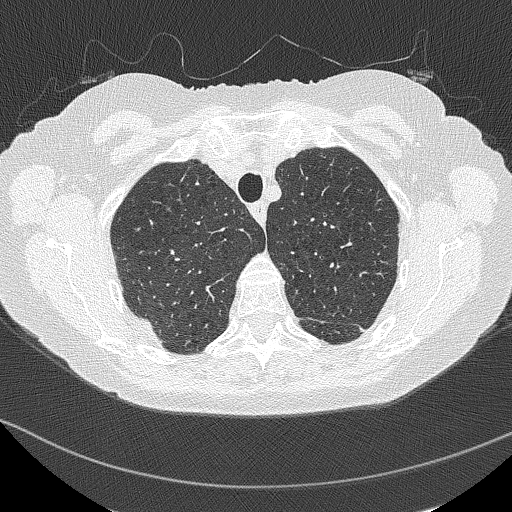
[im 286/301  lung]
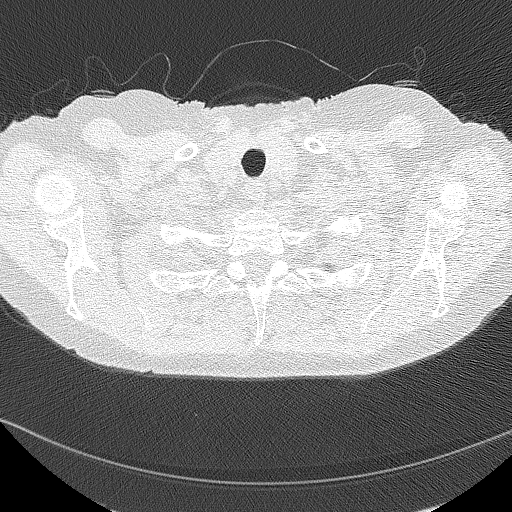

[Series 4: lung · coronal · 0.59mm/px · 3 of 290 slices shown (2 of 2)]
[im 58/290  lung]
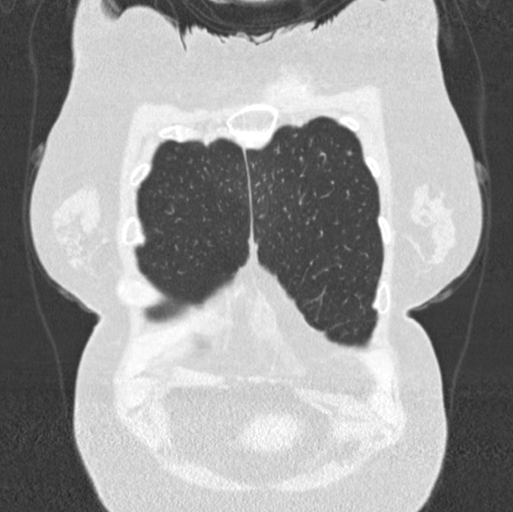
[im 116/290  lung]
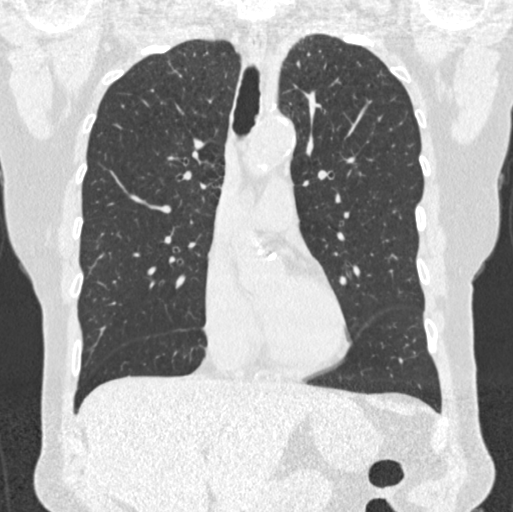
[im 174/290  lung]
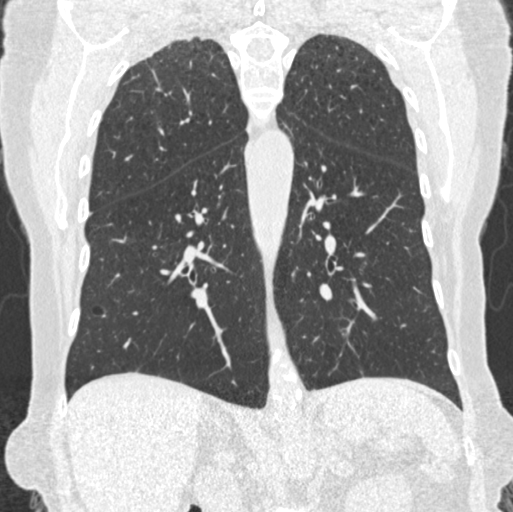

[15 of 40 positions shown; findings below may reference images not displayed]

FINDINGS: Cardiovascular: Atherosclerotic calcification of the arterial
vasculature, including coronary arteries. Heart size normal. No
pericardial effusion.

Mediastinum/Nodes: Mediastinal lymph nodes are not enlarged by CT
size criteria. Hilar regions are difficult to definitively evaluate
without IV contrast but appear grossly unremarkable. No axillary
adenopathy. Esophagus is grossly unremarkable. Small hiatal hernia.

Lungs/Pleura: Biapical pleuroparenchymal scarring. Centrilobular
emphysema. No worrisome pulmonary nodules. No pleural fluid. Airway
is unremarkable.

Upper Abdomen: Visualized portions of the liver and adrenal glands
are unremarkable. 1.3 cm low-attenuation lesion off the upper pole
right kidney is difficult to characterize due to size and lack of
postcontrast imaging. Visualized portions of the kidneys, spleen,
pancreas, stomach and bowel are unremarkable with exception of a
small hiatal hernia. No upper abdominal adenopathy.

Musculoskeletal: Degenerative changes in the spine. No worrisome
lytic or sclerotic lesions.
IMPRESSION: 1. Lung-RADS 1, negative. Continue annual screening with low-dose
chest CT without contrast in 12 months.
2. Aortic atherosclerosis (DY1NP-170.0). Coronary artery
calcification.
3.  Emphysema (DY1NP-NRR.V).

## 2019-03-02 ENCOUNTER — Ambulatory Visit
Admission: RE | Admit: 2019-03-02 | Discharge: 2019-03-02 | Disposition: A | Discharge status: Home / Self Care | Payer: BC Managed Care – PPO | Source: Ambulatory Visit | Attending: Oncology | Admitting: Oncology

## 2019-03-02 ENCOUNTER — Other Ambulatory Visit: Payer: Self-pay

## 2019-03-02 DIAGNOSIS — Z87891 Personal history of nicotine dependence: Secondary | ICD-10-CM | POA: Diagnosis present

## 2019-03-02 DIAGNOSIS — Z122 Encounter for screening for malignant neoplasm of respiratory organs: Secondary | ICD-10-CM | POA: Diagnosis not present

## 2019-03-04 ENCOUNTER — Telehealth: Payer: Self-pay | Admitting: *Deleted

## 2019-03-04 NOTE — Telephone Encounter (Signed)
Notified patient of LDCT lung cancer screening program results with recommendation for 6 month follow up imaging. Also notified of incidental findings noted below and is encouraged to discuss further with PCP who will receive a copy of this note and/or the CT report. Patient verbalizes understanding.   IMPRESSION: 1. Lung-RADS 2-S, benign appearance or behavior. Continue annual screening with low-dose chest CT without contrast in 12 months. 2. The "S" modifier above refers to potentially clinically significant non lung cancer related findings. Specifically, indeterminate 1.2 cm exophytic renal cortical lesion in the upper right kidney, stable since 11/18/2017 chest CT. MRI abdomen without and with IV contrast may be obtained for further characterization, to exclude renal cell carcinoma. 3. Two-vessel coronary atherosclerosis. 4. Small hiatal hernia.  Aortic Atherosclerosis (ICD10-I70.0) and Emphysema (ICD10-J43.9).  These results will be called to the ordering clinician or representative by the Radiologist Assistant, and communication documented in the PACS or zVision Dashboard.

## 2019-03-05 ENCOUNTER — Encounter: Payer: Self-pay | Admitting: Internal Medicine

## 2019-03-05 DIAGNOSIS — N281 Cyst of kidney, acquired: Secondary | ICD-10-CM | POA: Insufficient documentation

## 2019-03-05 DIAGNOSIS — N2889 Other specified disorders of kidney and ureter: Secondary | ICD-10-CM | POA: Insufficient documentation

## 2019-03-08 ENCOUNTER — Telehealth: Payer: Self-pay | Admitting: Internal Medicine

## 2019-03-08 ENCOUNTER — Other Ambulatory Visit: Payer: Self-pay | Admitting: Internal Medicine

## 2019-03-08 DIAGNOSIS — N2889 Other specified disorders of kidney and ureter: Secondary | ICD-10-CM

## 2019-03-08 NOTE — Telephone Encounter (Signed)
-----   Message from Reubin Milan, MD sent at 03/05/2019  6:23 PM EST ----- Regarding: right renal mass Call pt about the renal lesion seen on CT of the lungs.

## 2019-03-08 NOTE — Telephone Encounter (Signed)
Discussed with patient the abnormal findings in the right kidney on her recent LDCT screening exam.  On further review, the mass was present in 10/2017, same size, but was not mentioned in the impression. She is in agreement to proceed with MRI to further evaluate.  Orders placed and patient will be called to schedule.

## 2019-03-18 ENCOUNTER — Ambulatory Visit: Payer: BC Managed Care – PPO

## 2019-03-24 ENCOUNTER — Ambulatory Visit
Admission: EM | Admit: 2019-03-24 | Discharge: 2019-03-24 | Disposition: A | Discharge status: Home / Self Care | Payer: Worker's Compensation | Attending: Family Medicine | Admitting: Family Medicine

## 2019-03-24 ENCOUNTER — Encounter: Payer: Self-pay | Admitting: Emergency Medicine

## 2019-03-24 ENCOUNTER — Other Ambulatory Visit: Payer: Self-pay

## 2019-03-24 ENCOUNTER — Ambulatory Visit: Payer: BC Managed Care – PPO | Attending: Family Medicine

## 2019-03-24 DIAGNOSIS — M25511 Pain in right shoulder: Secondary | ICD-10-CM | POA: Diagnosis not present

## 2019-03-24 DIAGNOSIS — W01198A Fall on same level from slipping, tripping and stumbling with subsequent striking against other object, initial encounter: Secondary | ICD-10-CM | POA: Diagnosis not present

## 2019-03-24 DIAGNOSIS — S42201A Unspecified fracture of upper end of right humerus, initial encounter for closed fracture: Secondary | ICD-10-CM

## 2019-03-24 HISTORY — DX: Unspecified fracture of upper end of right humerus, initial encounter for closed fracture: S42.201A

## 2019-03-24 MED ORDER — HYDROCODONE-ACETAMINOPHEN 5-325 MG PO TABS: 1.0000 | ORAL_TABLET | Freq: Three times a day (TID) | ORAL | 0 refills | Status: DC | PRN

## 2019-03-24 NOTE — Discharge Instructions (Addendum)
Rest.  Wear sling.  Pain medication as needed.   Call [facility] [phone]) after talking with employer.  Take care  Dr. Adriana Simas

## 2019-03-24 NOTE — ED Triage Notes (Signed)
Patient in today stating that she tripped over a box at work and fell and landed on her right shoulder/arm. Patient states she feels a knot on her arm and can't lift her right arm. Urine Drug Screen collected per employer.

## 2019-03-24 NOTE — ED Provider Notes (Addendum)
[facility]    CSN: [id number] Arrival date & time: 03/24/19  1016      History   Chief Complaint Chief Complaint  Patient presents with  . Arm Injury    W/C DOI 03/24/19 right   HPI  74 year old female presents with right shoulder pain after suffering a fall at work.  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  Patient states that she tripped over a box at work and landed directly on her right shoulder.  She reports severe pain particular with range of motion of the shoulder.  She states that she really cannot lift her arm.  Pain is 7/10 in severity.  Worse with range of motion.  No relieving factors.  She states that she has injured this shoulder previously and has had surgery on it previously.  No other injuries.  No other complaints at this time.  Past Medical History:  Diagnosis Date  . COPD (chronic obstructive pulmonary disease) [facility])    Patient Active Problem List   Diagnosis Date Noted  . Renal mass 03/05/2019  . Prediabetes 10/20/2017  . Ovarian failure 10/20/2017  . Tobacco use disorder, moderate, in sustained remission 10/20/2017  . Allergic rhinitis 04/17/2017  . Vitamin D deficiency 02/22/2016  . COPD (chronic obstructive pulmonary disease) ([place]) 05/12/2014  . Family history of cancer of the kidney 05/12/2014  . Family history of cardiac disorder 05/12/2014  . Arthritis of knee, degenerative 05/12/2014    Past Surgical History:  Procedure Laterality Date  . ABDOMINAL HYSTERECTOMY    . bladder tack  2005  . ORIF FOREARM FRACTURE Right 2011    OB History   No obstetric history on file.      Home Medications    Prior to Admission medications   Medication Sig Start Date End Date Taking? Authorizing Provider  albuterol (PROVENTIL HFA;VENTOLIN HFA) 108 (90 Base) MCG/ACT inhaler Inhale 2 puffs into the lungs every 4 (four) hours as needed. 04/17/18  Yes Glean Hess, MD  baclofen (LIORESAL) 10 MG tablet Take 1 tablet (10 mg total) by mouth at  bedtime as needed for muscle spasms. 04/17/18  Yes Glean Hess, MD  Cholecalciferol (VITAMIN D3) 5000 units TABS Take by mouth.   Yes [provider]  SYMBICORT 160-4.5 MCG/ACT inhaler INHALE 2 PUFFS INTO THE LUNGS TWICE DAILY 11/15/18  Yes Glean Hess, MD    Family History Family History  Problem Relation Age of Onset  . Heart attack Mother   . Heart attack Father   . Renal cancer Sister     Social History Social History   Tobacco Use  . Smoking status: Former Smoker    Packs/day: 1.00    Years: 40.00    Pack years: 40.00    Types: Cigarettes    Quit date: 07/13/2014    Years since quitting: 4.6  . Smokeless tobacco: Never Used  Substance Use Topics  . Alcohol use: No  . Drug use: No     Allergies   Sulfa antibiotics   Review of Systems Review of Systems  Constitutional: Negative.   Musculoskeletal:       Right shoulder injury, pain, decreased ROM.   Physical Exam Triage Vital Signs ED Triage Vitals [03/24/19 1048]  Enc Vitals Group     BP (!) 144/76     Pulse Rate 78     Resp 18     Temp 98 F (36.7 C)     Temp Source Oral  SpO2 98 %     Weight 126 lb (57.2 kg)     Height 5\' 2"  (1.575 m)     Head Circumference      Peak Flow      Pain Score 7     Pain Loc      Pain Edu?      Excl. in GC?    Updated Vital Signs BP (!) 144/76 (BP Location: Left Arm)   Pulse 78   Temp 98 F (36.7 C) (Oral)   Resp 18   Ht 5\' 2"  (1.575 m)   Wt 57.2 kg   SpO2 98%   BMI 23.05 kg/m   Visual Acuity Right Eye Distance:   Left Eye Distance:   Bilateral Distance:    Right Eye Near:   Left Eye Near:    Bilateral Near:     Physical Exam Vitals and nursing note reviewed.  Constitutional:      General: She is not in acute distress.    Appearance: Normal appearance. She is not ill-appearing.  HENT:     Head: Normocephalic and atraumatic.  Eyes:     General:        Right eye: No discharge.        Left eye: No discharge.      Conjunctiva/sclera: Conjunctivae normal.  Cardiovascular:     Rate and Rhythm: Normal rate and regular rhythm.  Pulmonary:     Effort: Pulmonary effort is normal. No respiratory distress.     Breath sounds: Normal breath sounds.  Musculoskeletal:     Comments: Right shoulder -patient with arm close to body.  Essentially no range of motion without pain.  Mild tenderness anteriorly.   Neurological:     Mental Status: She is alert.  Psychiatric:        Mood and Affect: Mood normal.        Behavior: Behavior normal.    UC Treatments / Results  Labs (all labs ordered are listed, but only abnormal results are displayed) Labs Reviewed - No data to display  EKG   Radiology DG Shoulder Right  Result Date: 03/24/2019 CLINICAL DATA:  Fall EXAM: RIGHT SHOULDER - 2+ VIEW COMPARISON:  None . FINDINGS: Osseous demineralization. AC joint alignment normal. Comminuted displaced fracture at surgical neck RIGHT humerus. No dislocation identified. Visualized ribs intact. IMPRESSION: Comminuted displaced fracture at the surgical neck of the RIGHT humerus. Electronically Signed   By: M.D.   On: 03/24/2019 11:31    Procedures Procedures (including critical care time)  Medications Ordered in UC Medications - No data to display  Initial Impression / Assessment and Plan / UC Course  I have reviewed the triage vital signs and the nursing notes.  Pertinent labs & imaging results that were available during my care of the patient were reviewed by me and considered in my medical decision making (see chart for details).    74 year old female presents with an acute complicated injury.  Patient fell and suffered a comminuted, displaced fracture of the surgical neck of the right humerus.  Placed in a sling.  Discussed with orthopedics Dr. 03/26/2019.  Workmen's Comp. form filled out.  Treating pain with as needed Vicodin.  [place] controlled substance database reviewed.  Patient was not able to be  located.  Patient is to talk with her employer and to ensure that she is okay to see local orthopedics.  Final Clinical Impressions(s) / UC Diagnoses   Final diagnoses:  Closed fracture of  proximal end of right humerus, unspecified fracture morphology, initial encounter     Discharge Instructions     Rest.  Wear sling.  Pain medication as needed.      ED Prescriptions    None     I have reviewed the PDMP during this encounter.   Tommie Sams, DO 03/24/19 1153    [address] [place], [place] 03/24/19 [phone]

## 2019-03-28 ENCOUNTER — Ambulatory Visit: Payer: BC Managed Care – PPO

## 2019-04-20 ENCOUNTER — Ambulatory Visit (INDEPENDENT_AMBULATORY_CARE_PROVIDER_SITE_OTHER): Payer: BC Managed Care – PPO | Admitting: Internal Medicine

## 2019-04-20 ENCOUNTER — Encounter: Payer: Self-pay | Admitting: Internal Medicine

## 2019-04-20 ENCOUNTER — Other Ambulatory Visit: Payer: Self-pay

## 2019-04-20 VITALS — BP 112/64 | HR 67 | Temp 97.1°F | Ht 62.0 in | Wt 125.0 lb

## 2019-04-20 DIAGNOSIS — Z1211 Encounter for screening for malignant neoplasm of colon: Secondary | ICD-10-CM

## 2019-04-20 DIAGNOSIS — N2889 Other specified disorders of kidney and ureter: Secondary | ICD-10-CM | POA: Diagnosis not present

## 2019-04-20 DIAGNOSIS — Z0000 Encounter for general adult medical examination without abnormal findings: Secondary | ICD-10-CM | POA: Diagnosis not present

## 2019-04-20 DIAGNOSIS — E559 Vitamin D deficiency, unspecified: Secondary | ICD-10-CM | POA: Diagnosis not present

## 2019-04-20 DIAGNOSIS — S42209A Unspecified fracture of upper end of unspecified humerus, initial encounter for closed fracture: Secondary | ICD-10-CM | POA: Insufficient documentation

## 2019-04-20 DIAGNOSIS — R7303 Prediabetes: Secondary | ICD-10-CM | POA: Diagnosis not present

## 2019-04-20 DIAGNOSIS — J449 Chronic obstructive pulmonary disease, unspecified: Secondary | ICD-10-CM

## 2019-04-20 DIAGNOSIS — Z1231 Encounter for screening mammogram for malignant neoplasm of breast: Secondary | ICD-10-CM

## 2019-04-20 DIAGNOSIS — S42294A Other nondisplaced fracture of upper end of right humerus, initial encounter for closed fracture: Secondary | ICD-10-CM

## 2019-04-20 LAB — POCT URINALYSIS DIPSTICK
Bilirubin, UA: NEGATIVE
Glucose, UA: NEGATIVE
Ketones, UA: NEGATIVE
Leukocytes, UA: NEGATIVE
Nitrite, UA: NEGATIVE
Protein, UA: NEGATIVE
Spec Grav, UA: 1.02 (ref 1.010–1.025)
Urobilinogen, UA: 0.2 E.U./dL
pH, UA: 6.5 (ref 5.0–8.0)

## 2019-04-20 NOTE — Progress Notes (Signed)
Date:  04/20/2019   Name:  Lori Lambert   DOB:  [birth year]   MRN:  [id number]   Chief Complaint: Annual Exam (breast exam- no pap feb 24th fell and fractured humerus bone ) Desa Rech is a 73 y.o. female who presents today for her Complete Annual Exam. She feels fairly well except for shoulder pain from fx. She reports exercising none. She reports she is sleeping poorly due to arm pain. She denies breast issues.  Mammogram 2014 Colonoscopy - FIT 2020 DEXA - none Immunization History  Administered Date(s) Administered  . Influenza, High Dose Seasonal PF 10/20/2017  . Influenza, Seasonal, Injecte, Preservative Fre 01/03/2009, 02/27/2011, 10/28/2012  . Influenza-Unspecified 10/29/2018  . Pneumococcal Conjugate-13 02/21/2016  . Pneumococcal Polysaccharide-23 02/01/2011  . Tdap 04/18/2014    Diabetes She presents for her follow-up diabetic visit. Diabetes type: prediabetes. Her disease course has been stable. Pertinent negatives for hypoglycemia include no dizziness, headaches, nervousness/anxiousness or tremors. Pertinent negatives for diabetes include no chest pain, no fatigue, no polydipsia and no polyuria.  COPD - using symbicort daily and having no issues.  Uses albuterol rarely. Humerus fracture - fell last month and has a non displaced fracture of the right shoulder. It is being treated without surgery.  She has some pain but is slowly improving. We discussed need for DEXA in the next few months.  She continues on Vitamin D. Renal mass - MRI ordered for further evaluation.  Not yet scheduled due to arm fracture.  She will call to schedule when she feels she can lie flat.  Lab Results  Component Value Date   CREATININE 0.85 04/17/2018   BUN 13 04/17/2018   NA 139 04/17/2018   K 4.1 04/17/2018   CL 99 04/17/2018   CO2 26 04/17/2018   Lab Results  Component Value Date   CHOL 196 04/17/2018   HDL 54 04/17/2018   LDLCALC 122 (H) 04/17/2018   TRIG 102 04/17/2018     CHOLHDL 3.6 04/17/2018   Lab Results  Component Value Date   TSH 1.610 04/17/2018   Lab Results  Component Value Date   HGBA1C 5.9 (H) 04/17/2018   Lab Results  Component Value Date   WBC 8.4 04/17/2018   HGB 14.5 04/17/2018   HCT 42.1 04/17/2018   MCV 93 04/17/2018   PLT 327 04/17/2018   Lab Results  Component Value Date   ALT 13 04/17/2018   AST 20 04/17/2018   ALKPHOS 84 04/17/2018   BILITOT 0.3 04/17/2018     Review of Systems  Constitutional: Negative for chills, fatigue and fever.  HENT: Negative for congestion, hearing loss, tinnitus, trouble swallowing and voice change.   Eyes: Negative for visual disturbance.  Respiratory: Negative for cough, chest tightness, shortness of breath and wheezing.   Cardiovascular: Negative for chest pain, palpitations and leg swelling.  Gastrointestinal: Negative for abdominal pain, constipation, diarrhea and vomiting.  Endocrine: Negative for polydipsia and polyuria.  Genitourinary: Negative for dysuria, frequency, genital sores, vaginal bleeding and vaginal discharge.  Musculoskeletal: Positive for arthralgias (right shoulder). Negative for gait problem and joint swelling.  Skin: Negative for color change and rash.  Neurological: Negative for dizziness, tremors, light-headedness and headaches.  Hematological: Negative for adenopathy. Does not bruise/bleed easily.  Psychiatric/Behavioral: Negative for dysphoric mood and sleep disturbance. The patient is not nervous/anxious.     Patient Active Problem List   Diagnosis Date Noted  . Proximal humerus fracture 04/20/2019  . Renal mass 03/05/2019  .  Prediabetes 10/20/2017  . Ovarian failure 10/20/2017  . Tobacco use disorder, moderate, in sustained remission 10/20/2017  . Allergic rhinitis 04/17/2017  . Vitamin D deficiency 02/22/2016  . COPD (chronic obstructive pulmonary disease) ([facility]) 05/12/2014  . Family history of cancer of the kidney 05/12/2014  . Family history of  cardiac disorder 05/12/2014  . Arthritis of knee, degenerative 05/12/2014    Allergies  Allergen Reactions  . Sulfa Antibiotics     Past Surgical History:  Procedure Laterality Date  . ABDOMINAL HYSTERECTOMY    . bladder tack  2005  . ORIF FOREARM FRACTURE Right 2011    Social History   Tobacco Use  . Smoking status: Former Smoker    Packs/day: 1.00    Years: 40.00    Pack years: 40.00    Types: Cigarettes    Quit date: 07/13/2014    Years since quitting: 4.7  . Smokeless tobacco: Never Used  Substance Use Topics  . Alcohol use: No  . Drug use: No     Medication list has been reviewed and updated.  Current Meds  Medication Sig  . albuterol (PROVENTIL HFA;VENTOLIN HFA) 108 (90 Base) MCG/ACT inhaler Inhale 2 puffs into the lungs every 4 (four) hours as needed.  . baclofen (LIORESAL) 10 MG tablet Take 1 tablet (10 mg total) by mouth at bedtime as needed for muscle spasms.  . Cholecalciferol (VITAMIN D3) 5000 units TABS Take by mouth.  Marland Kitchen HYDROcodone-acetaminophen (NORCO/VICODIN) 5-325 MG tablet Take 1 tablet by mouth every 8 (eight) hours as needed.  . SYMBICORT 160-4.5 MCG/ACT inhaler INHALE 2 PUFFS INTO THE LUNGS TWICE DAILY    PHQ 2/9 Scores 04/20/2019 04/17/2018 04/17/2017 02/21/2016  PHQ - 2 Score 0 0 0 0  PHQ- 9 Score 4 - 0 -    BP Readings from Last 3 Encounters:  04/20/19 112/64  03/24/19 (!) 144/76  04/17/18 104/76    Physical Exam Vitals and nursing note reviewed.  Constitutional:      General: She is not in acute distress.    Appearance: She is well-developed.  HENT:     Head: Normocephalic and atraumatic.     Right Ear: Tympanic membrane and ear canal normal.     Left Ear: Tympanic membrane and ear canal normal.     Nose:     Right Sinus: No maxillary sinus tenderness.     Left Sinus: No maxillary sinus tenderness.  Eyes:     General: No scleral icterus.       Right eye: No discharge.        Left eye: No discharge.     Conjunctiva/sclera:  Conjunctivae normal.  Neck:     Thyroid: No thyromegaly.     Vascular: No carotid bruit.  Cardiovascular:     Rate and Rhythm: Normal rate and regular rhythm.     Pulses: Normal pulses.     Heart sounds: Normal heart sounds.  Pulmonary:     Effort: Pulmonary effort is normal. No respiratory distress.     Breath sounds: No wheezing.  Chest:     Breasts:        Right: No mass, nipple discharge, skin change or tenderness.        Left: No mass, nipple discharge, skin change or tenderness.  Abdominal:     General: Bowel sounds are normal.     Palpations: Abdomen is soft.     Tenderness: There is no abdominal tenderness.  Musculoskeletal:        General:  Normal range of motion.     Cervical back: Normal range of motion. No erythema.     Comments: Right shoulder in a sling  Lymphadenopathy:     Cervical: No cervical adenopathy.  Skin:    General: Skin is warm and dry.     Findings: No rash.  Neurological:     Mental Status: She is alert and oriented to person, place, and time.     Cranial Nerves: No cranial nerve deficit.     Sensory: No sensory deficit.     Deep Tendon Reflexes: Reflexes are normal and symmetric.  Psychiatric:        Speech: Speech normal.        Behavior: Behavior normal.        Thought Content: Thought content normal.     Wt Readings from Last 3 Encounters:  04/20/19 125 lb (56.7 kg)  03/24/19 126 lb (57.2 kg)  03/02/19 126 lb (57.2 kg)    BP 112/64   Pulse 67   Temp (!) 97.1 F (36.2 C) (Temporal)   Ht 5\' 2"  (1.575 m)   Wt 125 lb (56.7 kg)   SpO2 97%   BMI 22.86 kg/m   Assessment and Plan: 1. Annual physical exam Normal exam Continue healthy diet, exercise as able once fracture has healed - Lipid panel - TSH - POCT urinalysis dipstick  2. Encounter for screening mammogram for breast cancer Pt declines further screening mammograms  3. Chronic obstructive pulmonary disease, unspecified COPD type ([place]) Pulmonary Sx are stable on daily  Symbicort without frequent need for Albuterol Pneumonia vaccines are up to date She is still considering the Covid vaccine  4. Prediabetes Not requiring monitoring Recommend continue healthy diet - Comprehensive metabolic panel - Hemoglobin A1c  5. Other closed nondisplaced fracture of proximal end of right humerus, initial encounter Healing well with Ortho follow up  6. Renal mass Urine shows RBCs microscopically Still needs MRI to further evaluate but can't do it right now because of shoulder fracture She will call to schedule when she feels ready - CBC with Differential/Platelet  7. Vitamin D deficiency Continue supplementation Recommend DEXA in the next 6 mnths - VITAMIN D 25 Hydroxy (Vit-D Deficiency, Fractures)  8. Colon cancer screening - Fecal occult blood, imunochemical   Partially dictated using Editor, commissioning. Any errors are unintentional.  Halina Maidens, MD [facility]  04/20/2019

## 2019-04-20 NOTE — Patient Instructions (Signed)
MRI of the kidney - you will call and schedule that when you are ready  Bone Density (DEXA) will be needed in the next 6 months - call me for the order when you are ready  Consider a Mammogram once your shoulder is healed

## 2019-04-21 LAB — HEMOGLOBIN A1C
Est. average glucose Bld gHb Est-mCnc: 117 mg/dL
Hgb A1c MFr Bld: 5.7 % — ABNORMAL HIGH (ref 4.8–5.6)

## 2019-04-21 LAB — CBC WITH DIFFERENTIAL/PLATELET
Basophils Absolute: 0.1 10*3/uL (ref 0.0–0.2)
Basos: 1 %
EOS (ABSOLUTE): 0.3 10*3/uL (ref 0.0–0.4)
Eos: 4 %
Hematocrit: 43 % (ref 34.0–46.6)
Hemoglobin: 14.5 g/dL (ref 11.1–15.9)
Immature Grans (Abs): 0 10*3/uL (ref 0.0–0.1)
Immature Granulocytes: 0 %
Lymphocytes Absolute: 2.6 10*3/uL (ref 0.7–3.1)
Lymphs: 30 %
MCH: 32.4 pg (ref 26.6–33.0)
MCHC: 33.7 g/dL (ref 31.5–35.7)
MCV: 96 fL (ref 79–97)
Monocytes Absolute: 0.8 10*3/uL (ref 0.1–0.9)
Monocytes: 9 %
Neutrophils Absolute: 5 10*3/uL (ref 1.4–7.0)
Neutrophils: 56 %
Platelets: 362 10*3/uL (ref 150–450)
RBC: 4.48 x10E6/uL (ref 3.77–5.28)
RDW: 12.5 % (ref 11.7–15.4)
WBC: 8.8 10*3/uL (ref 3.4–10.8)

## 2019-04-21 LAB — COMPREHENSIVE METABOLIC PANEL
ALT: 18 IU/L (ref 0–32)
AST: 21 IU/L (ref 0–40)
Albumin/Globulin Ratio: 1.7 (ref 1.2–2.2)
Albumin: 4.3 g/dL (ref 3.7–4.7)
Alkaline Phosphatase: 100 IU/L (ref 39–117)
BUN/Creatinine Ratio: 13 (ref 12–28)
BUN: 11 mg/dL (ref 8–27)
Bilirubin Total: 0.3 mg/dL (ref 0.0–1.2)
CO2: 27 mmol/L (ref 20–29)
Calcium: 9.7 mg/dL (ref 8.7–10.3)
Chloride: 99 mmol/L (ref 96–106)
Creatinine, Ser: 0.82 mg/dL (ref 0.57–1.00)
GFR calc Af Amer: 82 mL/min/{1.73_m2} (ref >59)
GFR calc non Af Amer: 71 mL/min/{1.73_m2} (ref >59)
Globulin, Total: 2.6 g/dL (ref 1.5–4.5)
Glucose: 100 mg/dL — ABNORMAL HIGH (ref 65–99)
Potassium: 4 mmol/L (ref 3.5–5.2)
Sodium: 140 mmol/L (ref 134–144)
Total Protein: 6.9 g/dL (ref 6.0–8.5)

## 2019-04-21 LAB — VITAMIN D 25 HYDROXY (VIT D DEFICIENCY, FRACTURES): Vit D, 25-Hydroxy: 38 ng/mL (ref 30.0–100.0)

## 2019-04-21 LAB — LIPID PANEL
Chol/HDL Ratio: 3.7 ratio (ref 0.0–4.4)
Cholesterol, Total: 194 mg/dL (ref 100–199)
HDL: 53 mg/dL (ref >39)
LDL Chol Calc (NIH): 117 mg/dL — ABNORMAL HIGH (ref 0–99)
Triglycerides: 133 mg/dL (ref 0–149)
VLDL Cholesterol Cal: 24 mg/dL (ref 5–40)

## 2019-04-21 LAB — TSH: TSH: 2.92 u[IU]/mL (ref 0.450–4.500)

## 2019-05-06 DIAGNOSIS — Z1211 Encounter for screening for malignant neoplasm of colon: Secondary | ICD-10-CM | POA: Diagnosis not present

## 2019-05-08 LAB — SPECIMEN STATUS REPORT

## 2019-05-08 LAB — FECAL OCCULT BLOOD, IMMUNOCHEMICAL: Fecal Occult Bld: NEGATIVE

## 2019-05-17 ENCOUNTER — Ambulatory Visit (INDEPENDENT_AMBULATORY_CARE_PROVIDER_SITE_OTHER): Payer: BC Managed Care – PPO

## 2019-05-17 DIAGNOSIS — Z780 Asymptomatic menopausal state: Secondary | ICD-10-CM

## 2019-05-17 DIAGNOSIS — Z0000 Encounter for general adult medical examination without abnormal findings: Secondary | ICD-10-CM | POA: Diagnosis not present

## 2019-05-17 DIAGNOSIS — Z1231 Encounter for screening mammogram for malignant neoplasm of breast: Secondary | ICD-10-CM

## 2019-05-17 NOTE — Patient Instructions (Signed)
Lori Lambert , Thank you for taking time to come for your Medicare Wellness Visit. I appreciate your ongoing commitment to your health goals. Please review the following plan we discussed and let me know if I can assist you in the future.   Screening recommendations/referrals: Colonoscopy: done . Fecal occult blood test completed/  Mammogram: done 2014.  Please call [phone] to schedule your mammogram and bone density screening.  Bone Density: DUE.  Recommended yearly ophthalmology/optometry visit for glaucoma screening and checkup Recommended yearly dental visit for hygiene and checkup  Vaccinations: Influenza vaccine: done 10/29/18 Pneumococcal vaccine: done 02/21/16 Tdap vaccine: done 04/21/14 Shingles vaccine: Shingrix discussed. Please contact your pharmacy for coverage information.   Advanced directives: Advance directive discussed with you today. I have provided a copy for you to complete at home and have notarized. Once this is complete please bring a copy in to our office so we can scan it into your chart.  Conditions/risks identified: Recommend drinking 6-8 glasses of water per day  Next appointment: Please follow up in one year for your Medicare Annual Wellness visit.     Preventive Care 74 Years and Older, Female Preventive care refers to lifestyle choices and visits with your health care provider that can promote health and wellness. What does preventive care include?  A yearly physical exam. This is also called an annual well check.  Dental exams once or twice a year.  Routine eye exams. Ask your health care provider how often you should have your eyes checked.  Personal lifestyle choices, including:  Daily care of your teeth and gums.  Regular physical activity.  Eating a healthy diet.  Avoiding tobacco and drug use.  Limiting alcohol use.  Practicing safe sex.  Taking low-dose aspirin every day.  Taking vitamin and mineral supplements as recommended by  your health care provider. What happens during an annual well check? The services and screenings done by your health care provider during your annual well check will depend on your age, overall health, lifestyle risk factors, and family history of disease. Counseling  Your health care provider may ask you questions about your:  Alcohol use.  Tobacco use.  Drug use.  Emotional well-being.  Home and relationship well-being.  Sexual activity.  Eating habits.  History of falls.  Memory and ability to understand (cognition).  Work and work Astronomer.  Reproductive health. Screening  You may have the following tests or measurements:  Height, weight, and BMI.  Blood pressure.  Lipid and cholesterol levels. These may be checked every 5 years, or more frequently if you are over 5 years old.  Skin check.  Lung cancer screening. You may have this screening every year starting at age 56 if you have a 30-pack-year history of smoking and currently smoke or have quit within the past 15 years.  Fecal occult blood test (FOBT) of the stool. You may have this test every year starting at age 44.  Flexible sigmoidoscopy or colonoscopy. You may have a sigmoidoscopy every 5 years or a colonoscopy every 10 years starting at age 74.  Hepatitis C blood test.  Hepatitis B blood test.  Sexually transmitted disease (STD) testing.  Diabetes screening. This is done by checking your blood sugar (glucose) after you have not eaten for a while (fasting). You may have this done every 1-3 years.  Bone density scan. This is done to screen for osteoporosis. You may have this done starting at age 51.  Mammogram. This may be done every  1-2 years. Talk to your health care provider about how often you should have regular mammograms. Talk with your health care provider about your test results, treatment options, and if necessary, the need for more tests. Vaccines  Your health care provider may  recommend certain vaccines, such as:  Influenza vaccine. This is recommended every year.  Tetanus, diphtheria, and acellular pertussis (Tdap, Td) vaccine. You may need a Td booster every 10 years.  Zoster vaccine. You may need this after age 21.  Pneumococcal 13-valent conjugate (PCV13) vaccine. One dose is recommended after age 62.  Pneumococcal polysaccharide (PPSV23) vaccine. One dose is recommended after age 76. Talk to your health care provider about which screenings and vaccines you need and how often you need them. This information is not intended to replace advice given to you by your health care provider. Make sure you discuss any questions you have with your health care provider. Document Released: 02/10/2015 Document Revised: 10/04/2015 Document Reviewed: 11/15/2014 Elsevier Interactive Patient Education  2017 [place] Prevention in the Home Falls can cause injuries. They can happen to people of all ages. There are many things you can do to make your home safe and to help prevent falls. What can I do on the outside of my home?  Regularly fix the edges of walkways and driveways and fix any cracks.  Remove anything that might make you trip as you walk through a door, such as a raised step or threshold.  Trim any bushes or trees on the path to your home.  Use bright outdoor lighting.  Clear any walking paths of anything that might make someone trip, such as rocks or tools.  Regularly check to see if handrails are loose or broken. Make sure that both sides of any steps have handrails.  Any raised decks and porches should have guardrails on the edges.  Have any leaves, snow, or ice cleared regularly.  Use sand or salt on walking paths during winter.  Clean up any spills in your garage right away. This includes oil or grease spills. What can I do in the bathroom?  Use night lights.  Install grab bars by the toilet and in the tub and shower. Do not use towel  bars as grab bars.  Use non-skid mats or decals in the tub or shower.  If you need to sit down in the shower, use a plastic, non-slip stool.  Keep the floor dry. Clean up any water that spills on the floor as soon as it happens.  Remove soap buildup in the tub or shower regularly.  Attach bath mats securely with double-sided non-slip rug tape.  Do not have throw rugs and other things on the floor that can make you trip. What can I do in the bedroom?  Use night lights.  Make sure that you have a light by your bed that is easy to reach.  Do not use any sheets or blankets that are too big for your bed. They should not hang down onto the floor.  Have a firm chair that has side arms. You can use this for support while you get dressed.  Do not have throw rugs and other things on the floor that can make you trip. What can I do in the kitchen?  Clean up any spills right away.  Avoid walking on wet floors.  Keep items that you use a lot in easy-to-reach places.  If you need to reach something above you, use a strong  step stool that has a grab bar.  Keep electrical cords out of the way.  Do not use floor polish or wax that makes floors slippery. If you must use wax, use non-skid floor wax.  Do not have throw rugs and other things on the floor that can make you trip. What can I do with my stairs?  Do not leave any items on the stairs.  Make sure that there are handrails on both sides of the stairs and use them. Fix handrails that are broken or loose. Make sure that handrails are as long as the stairways.  Check any carpeting to make sure that it is firmly attached to the stairs. Fix any carpet that is loose or worn.  Avoid having throw rugs at the top or bottom of the stairs. If you do have throw rugs, attach them to the floor with carpet tape.  Make sure that you have a light switch at the top of the stairs and the bottom of the stairs. If you do not have them, ask someone to  add them for you. What else can I do to help prevent falls?  Wear shoes that:  Do not have high heels.  Have rubber bottoms.  Are comfortable and fit you well.  Are closed at the toe. Do not wear sandals.  If you use a stepladder:  Make sure that it is fully opened. Do not climb a closed stepladder.  Make sure that both sides of the stepladder are locked into place.  Ask someone to hold it for you, if possible.  Clearly mark and make sure that you can see:  Any grab bars or handrails.  First and last steps.  Where the edge of each step is.  Use tools that help you move around (mobility aids) if they are needed. These include:  Canes.  Walkers.  Scooters.  Crutches.  Turn on the lights when you go into a dark area. Replace any light bulbs as soon as they burn out.  Set up your furniture so you have a clear path. Avoid moving your furniture around.  If any of your floors are uneven, fix them.  If there are any pets around you, be aware of where they are.  Review your medicines with your doctor. Some medicines can make you feel dizzy. This can increase your chance of falling. Ask your doctor what other things that you can do to help prevent falls. This information is not intended to replace advice given to you by your health care provider. Make sure you discuss any questions you have with your health care provider. Document Released: 11/10/2008 Document Revised: 06/22/2015 Document Reviewed: 02/18/2014 Elsevier Interactive Patient Education  2017 [employer].

## 2019-05-17 NOTE — Progress Notes (Signed)
Subjective:   Lori Lambert is a 74 y.o. female who presents for an Initial Medicare Annual Wellness Visit.  Virtual Visit via Telephone Note  I connected with Lori Lambert on 05/17/19 at  3:20 PM EDT by telephone and verified that I am speaking with the correct person using two identifiers.  Medicare Annual Wellness visit completed telephonically due to Covid-19 pandemic.   Location: Patient: home Provider: office   I discussed the limitations, risks, security and privacy concerns of performing an evaluation and management service by telephone and the availability of in person appointments. The patient expressed understanding and agreed to proceed.  Some vital signs may be absent or patient reported.   Lori [name], LPN    Review of Systems      Cardiac Risk Factors include: advanced age (>47men, >28 women)     Objective:    There were no vitals filed for this visit. There is no height or weight on file to calculate BMI.  Advanced Directives 05/17/2019 08/21/2016 02/21/2016  Does Patient Have a Medical Advance Directive? No No No  Would patient like information on creating a medical advance directive? Yes (MAU/Ambulatory/Procedural Areas - Information given) - -    Current Medications (verified) Outpatient Encounter Medications as of 05/17/2019  Medication Sig  . albuterol (PROVENTIL HFA;VENTOLIN HFA) 108 (90 Base) MCG/ACT inhaler Inhale 2 puffs into the lungs every 4 (four) hours as needed.  . Cholecalciferol (VITAMIN D3) 5000 units TABS Take by mouth.  . nystatin (MYCOSTATIN) 100000 UNIT/ML suspension Take 5 mLs by mouth 4 (four) times daily.  . SYMBICORT 160-4.5 MCG/ACT inhaler INHALE 2 PUFFS INTO THE LUNGS TWICE DAILY  . [DISCONTINUED] baclofen (LIORESAL) 10 MG tablet Take 1 tablet (10 mg total) by mouth at bedtime as needed for muscle spasms.  . [DISCONTINUED] HYDROcodone-acetaminophen (NORCO/VICODIN) 5-325 MG tablet Take 1 tablet by mouth every 8 (eight)  hours as needed.   No facility-administered encounter medications on file as of 05/17/2019.    Allergies (verified) Sulfa antibiotics   History: Past Medical History:  Diagnosis Date  . COPD (chronic obstructive pulmonary disease) (HCC)    Past Surgical History:  Procedure Laterality Date  . ABDOMINAL HYSTERECTOMY    . bladder tack  2005  . ORIF FOREARM FRACTURE Right 2011   Family History  Problem Relation Age of Onset  . Heart attack Mother   . Heart attack Father   . Renal cancer Sister    Social History   Socioeconomic History  . Marital status: Single    Spouse name: Not on file  . Number of children: 2  . Years of education: Not on file  . Highest education level: Not on file  Occupational History  . Not on file  Tobacco Use  . Smoking status: Former Smoker    Packs/day: 1.00    Years: 40.00    Pack years: 40.00    Types: Cigarettes    Quit date: 07/13/2014    Years since quitting: 4.8  . Smokeless tobacco: Never Used  Substance and Sexual Activity  . Alcohol use: No  . Drug use: No  . Sexual activity: Not on file  Other Topics Concern  . Not on file  Social History Narrative   Pt's daughter lives with her.    Social Determinants of Health   Financial Resource Strain: Low Risk   . Difficulty of Paying Living Expenses: Not hard at all  Food Insecurity: No Food Insecurity  . Worried About Running  Out of Food in the Last Year: Never true  . Ran Out of Food in the Last Year: Never true  Transportation Needs: No Transportation Needs  . Lack of Transportation (Medical): No  . Lack of Transportation (Non-Medical): No  Physical Activity: Inactive  . Days of Exercise per Week: 0 days  . Minutes of Exercise per Session: 0 min  Stress: No Stress Concern Present  . Feeling of Stress : Only a little  Social Connections: Unknown  . Frequency of Communication with Friends and Family: Patient refused  . Frequency of Social Gatherings with Friends and Family:  Patient refused  . Attends Religious Services: Patient refused  . Active Member of Clubs or Organizations: Patient refused  . Attends Archivist Meetings: Patient refused  . Marital Status: Widowed    Tobacco Counseling Counseling given: Not Answered   Clinical Intake:  Pre-visit preparation completed: Yes  Pain : No/denies pain     Nutritional Risks: None Diabetes: No  How often do you need to have someone help you when you read instructions, pamphlets, or other written materials from your doctor or pharmacy?: 1 - Never  Interpreter Needed?: No  Information entered by :: Lori Marker LPN   Activities of Daily Living In your present state of health, do you have any difficulty performing the following activities: 05/17/2019  Hearing? N  Comment wears hearing aids  Vision? Y  Difficulty concentrating or making decisions? N  Walking or climbing stairs? N  Dressing or bathing? N  Doing errands, shopping? N  Preparing Food and eating ? N  Using the Toilet? N  In the past six months, have you accidently leaked urine? N  Do you have problems with loss of bowel control? N  Managing your Medications? N  Managing your Finances? N  Housekeeping or managing your Housekeeping? N  Some recent data might be hidden     Immunizations and Health Maintenance Immunization History  Administered Date(s) Administered  . Influenza, High Dose Seasonal PF 10/20/2017  . Influenza, Seasonal, Injecte, Preservative Fre 01/03/2009, 02/27/2011, 10/28/2012  . Influenza-Unspecified 10/29/2018  . Pneumococcal Conjugate-13 02/21/2016  . Pneumococcal Polysaccharide-23 02/01/2011  . Tdap 04/18/2014   Health Maintenance Due  Topic Date Due  . COVID-19 Vaccine (1) Never done  . DEXA SCAN  Never done  . MAMMOGRAM  01/01/2015    Patient Care Team: Glean Hess, MD as PCP - General (Internal Medicine)  Indicate any recent Medical Services you may have received from other than  [facility] providers in the past year (date may be approximate).     Assessment:   This is a routine wellness examination for Lori Lambert.  Hearing/Vision screen  Hearing Screening   125Hz  250Hz  500Hz  1000Hz  2000Hz  3000Hz  4000Hz  6000Hz  8000Hz   Right ear:           Left ear:           Comments: Pt denies hearing difficulty  Vision Screening Comments: Annual vision screenings done by Dr. Wyatt Portela at [facility].   Dietary issues and exercise activities discussed: Current Exercise Habits: The patient has a physically strenuous job, but has no regular exercise apart from work., Exercise limited by: respiratory conditions(s)  Goals    . DIET - INCREASE WATER INTAKE     Recommend drinking 6-8 glasses of water per day       Depression Screen PHQ 2/9 Scores 05/17/2019 04/20/2019 04/17/2018 04/17/2017 02/21/2016 01/01/2016  PHQ - 2 Score 0 0 0 0 0 0  PHQ- 9 Score - 4 - 0 - -    Fall Risk Fall Risk  05/17/2019 04/20/2019 04/17/2018 04/17/2017 02/21/2016  Falls in the past year? 1 1 0 No No  Number falls in past yr: 1 0 0 - -  Injury with Fall? 1 1 0 - -  Risk for fall due to : History of fall(s) History of fall(s) - - -  Follow up Falls prevention discussed Falls evaluation completed Falls evaluation completed - -    FALL RISK PREVENTION PERTAINING TO THE HOME:  Any stairs in or around the home? Yes  If so, do they handrails? Yes   Home free of loose throw rugs in walkways, pet beds, electrical cords, etc? Yes  Adequate lighting in your home to reduce risk of falls? Yes   ASSISTIVE DEVICES UTILIZED TO PREVENT FALLS:  Life alert? No  Use of a cane, walker or w/c? No  Grab bars in the bathroom? No  Shower chair or bench in shower? Yes  Elevated toilet seat or a handicapped toilet? No   DME ORDERS:  DME order needed?  No   TIMED UP AND GO:  Was the test performed? No . Telephonic visit.   Education: Fall risk prevention has been discussed.  Intervention(s) required? No   Cognitive Function:  pt declined 6CIT for 2021 AWV; states no memory issues.         Screening Tests Health Maintenance  Topic Date Due  . COVID-19 Vaccine (1) Never done  . DEXA SCAN  Never done  . MAMMOGRAM  01/01/2015  . INFLUENZA VACCINE  08/29/2019  . COLON CANCER SCREENING ANNUAL FOBT  05/05/2020  . TETANUS/TDAP  04/17/2024  . Hepatitis C Screening  Completed  . PNA vac Low Risk Adult  Completed  . COLONOSCOPY  Discontinued    Qualifies for Shingles Vaccine? Yes . Due for Shingrix. Education has been provided regarding the importance of this vaccine. Pt has been advised to call insurance company to determine out of pocket expense. Advised may also receive vaccine at local pharmacy or Health Dept. Verbalized acceptance and understanding.  Tdap: Up to date  Flu Vaccine: Up to date  Pneumococcal Vaccine: Up to date   Cancer Screenings:  Colorectal Screening: Completed 2008. Pt does FIT test yearly.   Mammogram: Completed 12/31/12. Repeat every year. Ordered today. Pt provided with contact information and advised to call to schedule appt.   Bone Density: DUE. Ordered today. Pt provided with contact information and advised to call to schedule appt.   Lung Cancer Screening: (Low Dose CT Chest recommended if Age 45-80 years, 30 pack-year currently smoking OR have quit w/in 15years.) does qualify. Completed 03/02/19.  Additional Screening:  Hep.atitis C Screening: does qualify; Completed 7/25/  Vision Screening: Recommended annual ophthalmology exams for early detection of glaucoma and other disorders of the eye. Is the patient up to date with their annual eye exam?  No  Who is the provider or what is the name of the office in which the pt attends annual eye exams? Dr. Clearance Coots  Dental Screening: Recommended annual dental exams for proper oral hygiene  Community Resource Referral:  CRR required this visit?  No      Plan:    I have personally reviewed and addressed the Medicare Annual  Wellness questionnaire and have noted the following in the patient's chart:  A. Medical and social history B. Use of alcohol, tobacco or illicit drugs  C. Current medications and supplements D. Functional  ability and status E.  Nutritional status F.  Physical activity G. Advance directives H. List of other physicians I.  Hospitalizations, surgeries, and ER visits in previous 12 months J.  Vitals K. Screenings such as hearing and vision if needed, cognitive and depression L. Referrals and appointments   In addition, I have reviewed and discussed with patient certain preventive protocols, quality metrics, and best practice recommendations. A written personalized care plan for preventive services as well as general preventive health recommendations were provided to patient.   Signed,  Lori [name], LPN Nurse Health Advisor   Nurse Notes: pt recovering well from broken humerus. Recently back to work and starts physical therapy on 05/21/19.

## 2019-06-01 ENCOUNTER — Ambulatory Visit
Admission: RE | Admit: 2019-06-01 | Discharge: 2019-06-01 | Disposition: A | Discharge status: Home / Self Care | Payer: BC Managed Care – PPO | Source: Ambulatory Visit | Attending: Internal Medicine | Admitting: Internal Medicine

## 2019-06-01 ENCOUNTER — Other Ambulatory Visit: Payer: Self-pay

## 2019-06-01 DIAGNOSIS — M818 Other osteoporosis without current pathological fracture: Secondary | ICD-10-CM | POA: Diagnosis not present

## 2019-06-01 DIAGNOSIS — Z780 Asymptomatic menopausal state: Secondary | ICD-10-CM | POA: Diagnosis not present

## 2019-06-01 DIAGNOSIS — M810 Age-related osteoporosis without current pathological fracture: Secondary | ICD-10-CM | POA: Diagnosis not present

## 2019-06-02 ENCOUNTER — Encounter: Payer: Self-pay | Admitting: Internal Medicine

## 2019-06-02 DIAGNOSIS — M810 Age-related osteoporosis without current pathological fracture: Secondary | ICD-10-CM | POA: Insufficient documentation

## 2019-11-18 ENCOUNTER — Other Ambulatory Visit: Payer: Self-pay | Admitting: Internal Medicine

## 2019-11-18 DIAGNOSIS — J449 Chronic obstructive pulmonary disease, unspecified: Secondary | ICD-10-CM

## 2020-03-03 ENCOUNTER — Telehealth: Payer: Self-pay | Admitting: *Deleted

## 2020-03-03 ENCOUNTER — Other Ambulatory Visit: Payer: Self-pay | Admitting: *Deleted

## 2020-03-03 DIAGNOSIS — Z122 Encounter for screening for malignant neoplasm of respiratory organs: Secondary | ICD-10-CM

## 2020-03-03 DIAGNOSIS — Z87891 Personal history of nicotine dependence: Secondary | ICD-10-CM

## 2020-03-03 NOTE — Telephone Encounter (Signed)
Attempted to contact and schedule lung screening scan. Message left for patient to call back to schedule. 

## 2020-03-03 NOTE — Progress Notes (Signed)
Contacted and scheduled for annual lung screening scan. Patient is a former smoker with a 07/13/14 quitdate and 40 pack year history

## 2020-03-08 ENCOUNTER — Ambulatory Visit
Admission: RE | Admit: 2020-03-08 | Discharge: 2020-03-08 | Disposition: A | Discharge status: Home / Self Care | Payer: BC Managed Care – PPO | Source: Ambulatory Visit | Attending: Oncology | Admitting: Oncology

## 2020-03-08 ENCOUNTER — Other Ambulatory Visit: Payer: Self-pay

## 2020-03-08 DIAGNOSIS — Z87891 Personal history of nicotine dependence: Secondary | ICD-10-CM | POA: Diagnosis not present

## 2020-03-08 DIAGNOSIS — Z122 Encounter for screening for malignant neoplasm of respiratory organs: Secondary | ICD-10-CM | POA: Diagnosis not present

## 2020-03-10 ENCOUNTER — Encounter: Payer: Self-pay | Admitting: *Deleted

## 2020-04-08 NOTE — Addendum Note (Signed)
Encounter addended by: Novella Olive on: 04/08/2020 4:55 PM  Actions taken: Letter saved

## 2020-04-20 ENCOUNTER — Other Ambulatory Visit: Payer: Self-pay

## 2020-04-20 ENCOUNTER — Encounter: Payer: Self-pay | Admitting: Internal Medicine

## 2020-04-20 ENCOUNTER — Ambulatory Visit (INDEPENDENT_AMBULATORY_CARE_PROVIDER_SITE_OTHER): Payer: BC Managed Care – PPO | Admitting: Internal Medicine

## 2020-04-20 VITALS — BP 130/74 | HR 96 | Temp 98.0°F | Ht 62.0 in | Wt 114.0 lb

## 2020-04-20 DIAGNOSIS — Z1211 Encounter for screening for malignant neoplasm of colon: Secondary | ICD-10-CM

## 2020-04-20 DIAGNOSIS — I700 Atherosclerosis of aorta: Secondary | ICD-10-CM | POA: Insufficient documentation

## 2020-04-20 DIAGNOSIS — N2889 Other specified disorders of kidney and ureter: Secondary | ICD-10-CM | POA: Diagnosis not present

## 2020-04-20 DIAGNOSIS — J439 Emphysema, unspecified: Secondary | ICD-10-CM | POA: Diagnosis not present

## 2020-04-20 DIAGNOSIS — Z1231 Encounter for screening mammogram for malignant neoplasm of breast: Secondary | ICD-10-CM

## 2020-04-20 DIAGNOSIS — M810 Age-related osteoporosis without current pathological fracture: Secondary | ICD-10-CM | POA: Insufficient documentation

## 2020-04-20 DIAGNOSIS — F17201 Nicotine dependence, unspecified, in remission: Secondary | ICD-10-CM | POA: Diagnosis not present

## 2020-04-20 DIAGNOSIS — Z0000 Encounter for general adult medical examination without abnormal findings: Secondary | ICD-10-CM

## 2020-04-20 DIAGNOSIS — R7303 Prediabetes: Secondary | ICD-10-CM

## 2020-04-20 DIAGNOSIS — M62830 Muscle spasm of back: Secondary | ICD-10-CM

## 2020-04-20 LAB — POCT URINALYSIS DIPSTICK
Bilirubin, UA: NEGATIVE
Glucose, UA: NEGATIVE
Ketones, UA: NEGATIVE
Leukocytes, UA: NEGATIVE
Nitrite, UA: NEGATIVE
Protein, UA: NEGATIVE
Spec Grav, UA: 1.01 (ref 1.010–1.025)
Urobilinogen, UA: 0.2 E.U./dL
pH, UA: 5 (ref 5.0–8.0)

## 2020-04-20 MED ORDER — ATORVASTATIN CALCIUM 10 MG PO TABS
10.0000 mg | ORAL_TABLET | Freq: Every day | ORAL | 3 refills | Status: DC
Start: 2020-04-20 — End: 2021-04-05

## 2020-04-20 MED ORDER — BACLOFEN 10 MG PO TABS: 10.0000 mg | ORAL_TABLET | Freq: Every evening | ORAL | 0 refills | Status: DC | PRN

## 2020-04-20 MED ORDER — ALENDRONATE SODIUM 70 MG PO TABS: 70.0000 mg | ORAL_TABLET | ORAL | 11 refills | Status: DC

## 2020-04-20 MED ORDER — ALBUTEROL SULFATE HFA 108 (90 BASE) MCG/ACT IN AERS: 2.0000 | INHALATION_SPRAY | RESPIRATORY_TRACT | 1 refills | Status: DC | PRN

## 2020-04-20 NOTE — Progress Notes (Signed)
Date:  04/20/2020   Name:  Lori Lambert   DOB:  12-17-1945   MRN:  [id number]   Chief Complaint: Annual Exam (Breast Exam. No pap- discontinued. )  Lori Lambert is a 75 y.o. female who presents today for her Complete Annual Exam. She feels well. She reports exercising - still working full time job. She reports she is sleeping fairly well. Breast complaints - none.  Mammogram: 2014 -  DEXA: 05/2019 Osteoporosis Pap smear: discontinued Colonoscopy: FIT 04/2019  Immunization History  Administered Date(s) Administered  . Influenza, High Dose Seasonal PF 10/20/2017  . Influenza, Seasonal, Injecte, Preservative Fre 01/03/2009, 02/27/2011, 10/28/2012  . Influenza-Unspecified 10/29/2018, 10/29/2019  . Moderna Sars-Covid-2 Vaccination 08/20/2019, 09/17/2019  . Pneumococcal Conjugate-13 02/21/2016  . Pneumococcal Polysaccharide-23 02/01/2011  . Tdap 04/18/2014    HPI COPD -  Still not smoking and is participating in lung cancer screening.    Renal mass - noted last year but pt declined MRI due to arm fracture at that time.  Recent screening showed no change in the size of the lesion.  She denies back pain or hematuria.  02/2020:  Exophytic 1.2 cm lesion upper pole right kidney is stable in the 1 year interval,, reassuring. Attenuation of this lesion remains higher than would be expected for a simple cyst. MRI abdomen with and without contrast could be used for more definitive characterization. 03/2019: indeterminate 1.2 cm exophytic renal cortical lesion in the upper right kidney, stable since 11/18/2017 chest CT. MRI abdomen without and with IV contrast may be obtained for further characterization, to exclude renal cell carcinoma.  Atherosclerosis - seen on CT screening.  She is not currently on any lipid lowering medication but would be interested in taking it.  Osteoporosis - on recent DEXA.  She takes vitamin D.  Has had several fractures of the arm and wrist and  would likely benefit from treatment.    Lab Results  Component Value Date   CREATININE 0.82 04/20/2019   BUN 11 04/20/2019   NA 140 04/20/2019   K 4.0 04/20/2019   CL 99 04/20/2019   CO2 27 04/20/2019   Lab Results  Component Value Date   CHOL 194 04/20/2019   HDL 53 04/20/2019   LDLCALC 117 (H) 04/20/2019   TRIG 133 04/20/2019   CHOLHDL 3.7 04/20/2019   Lab Results  Component Value Date   TSH 2.920 04/20/2019   Lab Results  Component Value Date   HGBA1C 5.7 (H) 04/20/2019   Lab Results  Component Value Date   WBC 8.8 04/20/2019   HGB 14.5 04/20/2019   HCT 43.0 04/20/2019   MCV 96 04/20/2019   PLT 362 04/20/2019   Lab Results  Component Value Date   ALT 18 04/20/2019   AST 21 04/20/2019   ALKPHOS 100 04/20/2019   BILITOT 0.3 04/20/2019     Review of Systems  Constitutional: Negative for chills, fatigue and fever.  HENT: Negative for congestion, hearing loss, tinnitus, trouble swallowing and voice change.   Eyes: Negative for visual disturbance.  Respiratory: Negative for cough, chest tightness, shortness of breath and wheezing.   Cardiovascular: Negative for chest pain, palpitations and leg swelling.  Gastrointestinal: Negative for abdominal pain, constipation, diarrhea and vomiting.  Endocrine: Negative for polydipsia and polyuria.  Genitourinary: Negative for dysuria, frequency, genital sores, vaginal bleeding and vaginal discharge.  Musculoskeletal: Negative for arthralgias, gait problem and joint swelling.  Skin: Negative for color change and rash.  Neurological: Negative for  dizziness, tremors, light-headedness and headaches.  Hematological: Negative for adenopathy. Does not bruise/bleed easily.  Psychiatric/Behavioral: Negative for dysphoric mood and sleep disturbance. The patient is not nervous/anxious.     Patient Active Problem List   Diagnosis Date Noted  . Age-related osteoporosis without current pathological fracture 04/20/2020  . Pulmonary  emphysema (HCC) 04/20/2020  . Aortic atherosclerosis (HCC) 04/20/2020  . Proximal humerus fracture 04/20/2019  . Renal mass 03/05/2019  . Prediabetes 10/20/2017  . Tobacco use disorder, moderate, in sustained remission 10/20/2017  . Allergic rhinitis 04/17/2017  . Vitamin D deficiency 02/22/2016  . Family history of cancer of the kidney 05/12/2014  . Arthritis of knee, degenerative 05/12/2014  . Sleep disorder 06/28/2013    Allergies  Allergen Reactions  . Sulfa Antibiotics     Past Surgical History:  Procedure Laterality Date  . ABDOMINAL HYSTERECTOMY    . bladder tack  2005  . ORIF FOREARM FRACTURE Right 2011    Social History   Tobacco Use  . Smoking status: Former Smoker    Packs/day: 1.00    Years: 40.00    Pack years: 40.00    Types: Cigarettes    Quit date: 07/13/2014    Years since quitting: 5.7  . Smokeless tobacco: Never Used  Vaping Use  . Vaping Use: Never used  Substance Use Topics  . Alcohol use: No  . Drug use: No     Medication list has been reviewed and updated.  Current Meds  Medication Sig  . albuterol (PROVENTIL HFA;VENTOLIN HFA) 108 (90 Base) MCG/ACT inhaler Inhale 2 puffs into the lungs every 4 (four) hours as needed.  . Cholecalciferol (VITAMIN D3) 5000 units TABS Take by mouth.  . nystatin (MYCOSTATIN) 100000 UNIT/ML suspension Take 5 mLs by mouth 4 (four) times daily.  . SYMBICORT 160-4.5 MCG/ACT inhaler INHALE 2 PUFFS INTO THE LUNGS TWICE DAILY    PHQ 2/9 Scores 04/20/2020 05/17/2019 04/20/2019 04/17/2018  PHQ - 2 Score 0 0 0 0  PHQ- 9 Score 2 - 4 -    GAD 7 : Generalized Anxiety Score 04/20/2020 04/20/2019  Nervous, Anxious, on Edge 0 0  Control/stop worrying 0 0  Worry too much - different things 0 0  Trouble relaxing 0 1  Restless 0 0  Easily annoyed or irritable 0 0  Afraid - awful might happen 0 0  Total GAD 7 Score 0 1  Anxiety Difficulty - Not difficult at all    BP Readings from Last 3 Encounters:  04/20/20 130/74   04/20/19 112/64  03/24/19 (!) 144/76    Physical Exam Vitals and nursing note reviewed.  Constitutional:      General: She is not in acute distress.    Appearance: She is well-developed.  HENT:     Head: Normocephalic and atraumatic.     Right Ear: Tympanic membrane and ear canal normal.     Left Ear: Tympanic membrane and ear canal normal.     Nose:     Right Sinus: No maxillary sinus tenderness.     Left Sinus: No maxillary sinus tenderness.  Eyes:     General: No scleral icterus.       Right eye: No discharge.        Left eye: No discharge.     Conjunctiva/sclera: Conjunctivae normal.  Neck:     Thyroid: No thyromegaly.     Vascular: No carotid bruit.  Cardiovascular:     Rate and Rhythm: Normal rate and regular rhythm.  Pulses: Normal pulses.     Heart sounds: Normal heart sounds.  Pulmonary:     Effort: Pulmonary effort is normal. No respiratory distress.     Breath sounds: No wheezing.  Chest:  Breasts:     Right: No mass, nipple discharge, skin change or tenderness.     Left: No mass, nipple discharge, skin change or tenderness.    Abdominal:     General: Bowel sounds are normal.     Palpations: Abdomen is soft.     Tenderness: There is no abdominal tenderness.  Musculoskeletal:     Cervical back: Normal range of motion. No erythema.     Right lower leg: No edema.     Left lower leg: No edema.  Lymphadenopathy:     Cervical: No cervical adenopathy.  Skin:    General: Skin is warm and dry.     Findings: No rash.  Neurological:     Mental Status: She is alert and oriented to person, place, and time.     Cranial Nerves: No cranial nerve deficit.     Sensory: No sensory deficit.     Deep Tendon Reflexes: Reflexes are normal and symmetric.  Psychiatric:        Attention and Perception: Attention normal.        Mood and Affect: Mood normal.     Wt Readings from Last 3 Encounters:  04/20/20 114 lb (51.7 kg)  03/08/20 117 lb (53.1 kg)  04/20/19 125  lb (56.7 kg)    BP 130/74   Pulse 96   Temp 98 F (36.7 C) (Oral)   Ht 5\' 2"  (1.575 m)   Wt 114 lb (51.7 kg)   SpO2 93%   BMI 20.85 kg/m   Assessment and Plan: 1. Annual physical exam Normal exam Immunizations up to date - Lipid panel  2. Encounter for screening mammogram for breast cancer Schedule mammogram - due annually - MM 3D SCREEN BREAST BILATERAL; Future  3. Colon cancer screening - Fecal occult blood, imunochemical  4. Pulmonary emphysema, unspecified emphysema type (HCC) Stable with minimal sx; only using albuterol PRN - albuterol (VENTOLIN HFA) 108 (90 Base) MCG/ACT inhaler; Inhale 2 puffs into the lungs every 4 (four) hours as needed.  Dispense: 1 each; Refill: 1  5. Prediabetes Check labs Continue low carb diet - Comprehensive metabolic panel - Hemoglobin A1c - TSH - POCT urinalysis dipstick  6. Age-related osteoporosis without current pathological fracture Continue vitamin D; add calcium daily Begin Fosamax Repeat DEXA in 2 years - alendronate (FOSAMAX) 70 MG tablet; Take 1 tablet (70 mg total) by mouth every 7 (seven) days. Take with a full glass of water on an empty stomach.  Dispense: 4 tablet; Refill: 11  7. Tobacco use disorder, moderate, in sustained remission Doing well with no malignancy on CT screening  8. Renal mass With hematuria noted today - will order MRI to further evaluate - Comprehensive metabolic panel - POCT urinalysis dipstick  9. Aortic atherosclerosis (HCC) Begin statin therapy Follow up in 6 months - atorvastatin (LIPITOR) 10 MG tablet; Take 1 tablet (10 mg total) by mouth daily.  Dispense: 90 tablet; Refill: 3  10. Muscle spasm of back - baclofen (LIORESAL) 10 MG tablet; Take 1 tablet (10 mg total) by mouth at bedtime as needed for muscle spasms.  Dispense: 30 each; Refill: 0     Partially dictated using . Any errors are unintentional.  Animal nutritionist, MD [facility]  04/20/2020      

## 2020-04-20 NOTE — Patient Instructions (Signed)
Start Fosamax once a week - take exactly as directed.  Also take calcium supplement and vitamin D daily which is needed for the Fosamax to work.  Begin Atorvastatin for cholesterol/atherosclerosis.  Come back in 6 mo to recheck.  MRI of the kidney will be ordered.  Someone will call you with the appointment.  Schedule a Mammogram.

## 2020-04-21 LAB — LIPID PANEL
Chol/HDL Ratio: 3.8 ratio (ref 0.0–4.4)
Cholesterol, Total: 214 mg/dL — ABNORMAL HIGH (ref 100–199)
HDL: 56 mg/dL (ref >39)
LDL Chol Calc (NIH): 131 mg/dL — ABNORMAL HIGH (ref 0–99)
Triglycerides: 152 mg/dL — ABNORMAL HIGH (ref 0–149)
VLDL Cholesterol Cal: 27 mg/dL (ref 5–40)

## 2020-04-21 LAB — COMPREHENSIVE METABOLIC PANEL
ALT: 14 IU/L (ref 0–32)
AST: 20 IU/L (ref 0–40)
Albumin/Globulin Ratio: 1.8 (ref 1.2–2.2)
Albumin: 4.5 g/dL (ref 3.7–4.7)
Alkaline Phosphatase: 80 IU/L (ref 44–121)
BUN/Creatinine Ratio: 12 (ref 12–28)
BUN: 12 mg/dL (ref 8–27)
Bilirubin Total: 0.3 mg/dL (ref 0.0–1.2)
CO2: 24 mmol/L (ref 20–29)
Calcium: 9.6 mg/dL (ref 8.7–10.3)
Chloride: 103 mmol/L (ref 96–106)
Creatinine, Ser: 1.01 mg/dL — ABNORMAL HIGH (ref 0.57–1.00)
Globulin, Total: 2.5 g/dL (ref 1.5–4.5)
Glucose: 107 mg/dL — ABNORMAL HIGH (ref 65–99)
Potassium: 4.2 mmol/L (ref 3.5–5.2)
Sodium: 145 mmol/L — ABNORMAL HIGH (ref 134–144)
Total Protein: 7 g/dL (ref 6.0–8.5)
eGFR: 58 mL/min/{1.73_m2} — ABNORMAL LOW (ref >59)

## 2020-04-21 LAB — HEMOGLOBIN A1C
Est. average glucose Bld gHb Est-mCnc: 126 mg/dL
Hgb A1c MFr Bld: 6 % — ABNORMAL HIGH (ref 4.8–5.6)

## 2020-04-21 LAB — TSH: TSH: 2.32 u[IU]/mL (ref 0.450–4.500)

## 2020-04-21 NOTE — Progress Notes (Signed)
Called pt home phone. Phone was busy. Could not leave VM.  Will route result note to [facility] Nurse Triage for follow up when patient returns call to clinic. Nurse may give results to patient if they return call. CRM created for this message.   KP

## 2020-05-06 ENCOUNTER — Other Ambulatory Visit: Payer: Self-pay

## 2020-05-06 ENCOUNTER — Ambulatory Visit
Admission: RE | Admit: 2020-05-06 | Discharge: 2020-05-06 | Disposition: A | Discharge status: Home / Self Care | Payer: BC Managed Care – PPO | Source: Ambulatory Visit | Attending: Internal Medicine | Admitting: Internal Medicine

## 2020-05-06 DIAGNOSIS — K7689 Other specified diseases of liver: Secondary | ICD-10-CM | POA: Diagnosis not present

## 2020-05-06 DIAGNOSIS — N281 Cyst of kidney, acquired: Secondary | ICD-10-CM | POA: Diagnosis not present

## 2020-05-06 DIAGNOSIS — M47816 Spondylosis without myelopathy or radiculopathy, lumbar region: Secondary | ICD-10-CM | POA: Diagnosis not present

## 2020-05-06 DIAGNOSIS — N2889 Other specified disorders of kidney and ureter: Secondary | ICD-10-CM | POA: Diagnosis not present

## 2020-05-06 MED ORDER — GADOBUTROL 1 MMOL/ML IV SOLN
5.0000 mL | Freq: Once | INTRAVENOUS | Status: AC | PRN
  Administered 2020-05-06: 5 mL via INTRAVENOUS

## 2020-05-08 ENCOUNTER — Encounter: Payer: Self-pay | Admitting: Internal Medicine

## 2020-05-17 ENCOUNTER — Other Ambulatory Visit: Payer: Self-pay | Admitting: Internal Medicine

## 2020-05-17 ENCOUNTER — Ambulatory Visit: Payer: BC Managed Care – PPO

## 2020-05-17 DIAGNOSIS — M62830 Muscle spasm of back: Secondary | ICD-10-CM

## 2020-05-17 NOTE — Telephone Encounter (Signed)
Requested medications are due for refill today.  yes  Requested medications are on the active medications list.  yes  Last refill. 04/20/2020  Future visit scheduled.   no  Notes to clinic.  Medication not delegated.

## 2020-05-22 ENCOUNTER — Ambulatory Visit (INDEPENDENT_AMBULATORY_CARE_PROVIDER_SITE_OTHER): Payer: BC Managed Care – PPO

## 2020-05-22 ENCOUNTER — Other Ambulatory Visit: Payer: Self-pay

## 2020-05-22 VITALS — BP 100/70 | HR 83 | Temp 98.2°F | Resp 16 | Ht 62.0 in | Wt 115.4 lb

## 2020-05-22 DIAGNOSIS — Z0000 Encounter for general adult medical examination without abnormal findings: Secondary | ICD-10-CM | POA: Diagnosis not present

## 2020-05-22 NOTE — Progress Notes (Signed)
Subjective:   Tilden Fossalice Palmer Hehir is a 75 y.o. female who presents for Medicare Annual (Subsequent) preventive examination.  Review of Systems     Cardiac Risk Factors include: advanced age (>1655men, 85>65 women);dyslipidemia     Objective:    Today's Vitals   05/22/20 1532 05/22/20 1533  BP: 100/70   Pulse: 83   Resp: 16   Temp: 98.2 F (36.8 C)   TempSrc: Oral   SpO2: 97%   Weight: 115 lb 6.4 oz (52.3 kg)   Height: 5\' 2"  (1.575 m)   PainSc:  0-No pain   Body mass index is 21.11 kg/m.  Advanced Directives 05/22/2020 05/17/2019 08/21/2016 02/21/2016  Does Patient Have a Medical Advance Directive? No No No No  Would patient like information on creating a medical advance directive? Yes (MAU/Ambulatory/Procedural Areas - Information given) Yes (MAU/Ambulatory/Procedural Areas - Information given) - -    Current Medications (verified) Outpatient Encounter Medications as of 05/22/2020  Medication Sig  . albuterol (VENTOLIN HFA) 108 (90 Base) MCG/ACT inhaler Inhale 2 puffs into the lungs every 4 (four) hours as needed.  Marland Kitchen. alendronate (FOSAMAX) 70 MG tablet Take 1 tablet (70 mg total) by mouth every 7 (seven) days. Take with a full glass of water on an empty stomach.  Marland Kitchen. atorvastatin (LIPITOR) 10 MG tablet Take 1 tablet (10 mg total) by mouth daily.  . baclofen (LIORESAL) 10 MG tablet TAKE 1 TABLET(10 MG) BY MOUTH AT BEDTIME AS NEEDED FOR MUSCLE SPASMS  . Cholecalciferol (VITAMIN D3) 5000 units TABS Take by mouth.  . nystatin (MYCOSTATIN) 100000 UNIT/ML suspension Take 5 mLs by mouth 4 (four) times daily.  . SYMBICORT 160-4.5 MCG/ACT inhaler INHALE 2 PUFFS INTO THE LUNGS TWICE DAILY   No facility-administered encounter medications on file as of 05/22/2020.    Allergies (verified) Sulfa antibiotics   History: Past Medical History:  Diagnosis Date  . COPD (chronic obstructive pulmonary disease) (HCC)    Past Surgical History:  Procedure Laterality Date  . ABDOMINAL HYSTERECTOMY     . bladder tack  2005  . ORIF FOREARM FRACTURE Right 2011   Family History  Problem Relation Age of Onset  . Heart attack Mother   . Heart attack Father   . Renal cancer Sister    Social History   Socioeconomic History  . Marital status: Single    Spouse name: Not on file  . Number of children: 2  . Years of education: Not on file  . Highest education level: Not on file  Occupational History  . Not on file  Tobacco Use  . Smoking status: Former Smoker    Packs/day: 1.00    Years: 40.00    Pack years: 40.00    Types: Cigarettes    Quit date: 07/13/2014    Years since quitting: 5.8  . Smokeless tobacco: Never Used  Vaping Use  . Vaping Use: Never used  Substance and Sexual Activity  . Alcohol use: No  . Drug use: No  . Sexual activity: Not on file  Other Topics Concern  . Not on file  Social History Narrative   Pt's daughter lives with her.    Social Determinants of Health   Financial Resource Strain: Low Risk   . Difficulty of Paying Living Expenses: Not hard at all  Food Insecurity: No Food Insecurity  . Worried About Programme researcher, broadcasting/film/videounning Out of Food in the Last Year: Never true  . Ran Out of Food in the Last Year: Never true  Transportation Needs: No Transportation Needs  . Lack of Transportation (Medical): No  . Lack of Transportation (Non-Medical): No  Physical Activity: Inactive  . Days of Exercise per Week: 0 days  . Minutes of Exercise per Session: 0 min  Stress: No Stress Concern Present  . Feeling of Stress : Not at all  Social Connections: Socially Isolated  . Frequency of Communication with Friends and Family: More than three times a week  . Frequency of Social Gatherings with Friends and Family: More than three times a week  . Attends Religious Services: Never  . Active Member of Clubs or Organizations: No  . Attends Banker Meetings: Never  . Marital Status: Widowed    Tobacco Counseling Counseling given: Not Answered   Clinical  Intake:  Pre-visit preparation completed: Yes  Pain : No/denies pain Pain Score: 0-No pain     BMI - recorded: 21.11 Nutritional Status: BMI of 19-24  Normal Nutritional Risks: None Diabetes: No  How often do you need to have someone help you when you read instructions, pamphlets, or other written materials from your doctor or pharmacy?: 1 - Never    Interpreter Needed?: No  Information entered by :: Reather [name] LPN   Activities of Daily Living In your present state of health, do you have any difficulty performing the following activities: 05/22/2020  Hearing? N  Comment declines hearing aids  Vision? N  Difficulty concentrating or making decisions? N  Walking or climbing stairs? N  Dressing or bathing? N  Doing errands, shopping? N  Preparing Food and eating ? N  Using the Toilet? N  In the past six months, have you accidently leaked urine? N  Do you have problems with loss of bowel control? N  Managing your Medications? N  Managing your Finances? N  Housekeeping or managing your Housekeeping? N  Some recent data might be hidden    Patient Care Team: Reubin Milan, MD as PCP - General (Internal Medicine)  Indicate any recent Medical Services you may have received from other than [facility] providers in the past year (date may be approximate).     Assessment:   This is a routine wellness examination for Isys.  Hearing/Vision screen  Hearing Screening   125Hz  250Hz  500Hz  1000Hz  2000Hz  3000Hz  4000Hz  6000Hz  8000Hz   Right ear:           Left ear:           Comments: Pt denies hearing difficulty  Vision Screening Comments: Annual vision screenings done by Dr[facility] at [facility]. Due for exam.  Dietary issues and exercise activities discussed: Current Exercise Habits: The patient has a physically strenuous job, but has no regular exercise apart from work., Exercise limited by: respiratory conditions(s)  Goals    . DIET - INCREASE WATER INTAKE     Recommend  drinking 6-8 glasses of water per day       Depression Screen PHQ 2/9 Scores 05/22/2020 04/20/2020 05/17/2019 04/20/2019 04/17/2018 04/17/2017 02/21/2016  PHQ - 2 Score 0 0 0 0 0 0 0  PHQ- 9 Score - 2 - 4 - 0 -    Fall Risk Fall Risk  05/22/2020 04/20/2020 05/17/2019 04/20/2019 04/17/2018  Falls in the past year? 0 1 1 1  0  Number falls in past yr: 0 0 1 0 0  Injury with Fall? 0 1 1 1  0  Risk for fall due to : No Fall Risks - History of fall(s) History of fall(s) -  Follow up Falls prevention discussed Falls evaluation completed Falls prevention discussed Falls evaluation completed Falls evaluation completed    FALL RISK PREVENTION PERTAINING TO THE HOME:  Any stairs in or around the home? Yes  If so, are there any without handrails? No  Home free of loose throw rugs in walkways, pet beds, electrical cords, etc? Yes  Adequate lighting in your home to reduce risk of falls? Yes   ASSISTIVE DEVICES UTILIZED TO PREVENT FALLS:  Life alert? No  Use of a cane, walker or w/c? No  Grab bars in the bathroom? No  Shower chair or bench in shower? Yes  Elevated toilet seat or a handicapped toilet? No   TIMED UP AND GO:  Was the test performed? Yes .  Length of time to ambulate 10 feet: 4 sec.   Gait steady and fast without use of assistive device  Cognitive Function: Normal cognitive status assessed by direct observation by this Nurse Health Advisor. No abnormalities found.          Immunizations Immunization History  Administered Date(s) Administered  . Influenza, High Dose Seasonal PF 10/20/2017  . Influenza, Seasonal, Injecte, Preservative Fre 01/03/2009, 02/27/2011, 10/28/2012  . Influenza-Unspecified 10/29/2018, 10/29/2019  . Moderna Sars-Covid-2 Vaccination 08/20/2019, 09/17/2019  . Pneumococcal Conjugate-13 02/21/2016  . Pneumococcal Polysaccharide-23 02/01/2011  . Tdap 04/18/2014    TDAP status: Up to date  Flu Vaccine status: Up to date  Pneumococcal vaccine status: Up  to date  Covid-19 vaccine status: Completed vaccines  Qualifies for Shingles Vaccine? Yes   Zostavax completed No   Shingrix Completed?: No.    Education has been provided regarding the importance of this vaccine. Patient has been advised to call insurance company to determine out of pocket expense if they have not yet received this vaccine. Advised may also receive vaccine at local pharmacy or Health Dept. Verbalized acceptance and understanding.  Screening Tests Health Maintenance  Topic Date Due  . MAMMOGRAM  01/01/2015  . COVID-19 Vaccine (3 - Booster for Moderna series) 03/19/2020  . COLON CANCER SCREENING ANNUAL FOBT  05/05/2020  . INFLUENZA VACCINE  08/28/2020  . TETANUS/TDAP  04/17/2024  . DEXA SCAN  Completed  . Hepatitis C Screening  Completed  . PNA vac Low Risk Adult  Completed  . HPV VACCINES  Aged Out  . COLONOSCOPY (Pts 45-37yrs Insurance coverage will need to be confirmed)  Discontinued    Health Maintenance  Health Maintenance Due  Topic Date Due  . MAMMOGRAM  01/01/2015  . COVID-19 Vaccine (3 - Booster for Moderna series) 03/19/2020  . COLON CANCER SCREENING ANNUAL FOBT  05/05/2020    Colorectal cancer screening: Type of screening: FOBT/FIT. Completed 05/06/19. Repeat every 1 years. Pt states she is mailing sample today.   Mammogram status: Completed 12/31/12. Repeat every year  Bone Density status: Completed 06/01/19. Results reflect: Bone density results: OSTEOPOROSIS. Repeat every 2 years.  Lung Cancer Screening: (Low Dose CT Chest recommended if Age 34-80 years, 30 pack-year currently smoking OR have quit w/in 15years.) does qualify. Completed 03/08/20.   Additional Screening:  Hepatitis C Screening: does qualify; Completed 08/21/16  Vision Screening: Recommended annual ophthalmology exams for early detection of glaucoma and other disorders of the eye. Is the patient up to date with their annual eye exam?  No  Who is the provider or what is the name of the  office in which the patient attends annual eye exams? Dr. Clearance Coots  Dental Screening: Recommended annual dental exams for proper  oral hygiene  Community Resource Referral / Chronic Care Management: CRR required this visit?  No   CCM required this visit?  No      Plan:     I have personally reviewed and noted the following in the patient's chart:   . Medical and social history . Use of alcohol, tobacco or illicit drugs  . Current medications and supplements . Functional ability and status . Nutritional status . Physical activity . Advanced directives . List of other physicians . Hospitalizations, surgeries, and ER visits in previous 12 months . Vitals . Screenings to include cognitive, depression, and falls . Referrals and appointments  In addition, I have reviewed and discussed with patient certain preventive protocols, quality metrics, and best practice recommendations. A written personalized care plan for preventive services as well as general preventive health recommendations were provided to patient.     Reather [name], LPN   7/74/1423   Nurse Notes: none

## 2020-05-22 NOTE — Patient Instructions (Signed)
Lori Lambert , Thank you for taking time to come for your Medicare Wellness Visit. I appreciate your ongoing commitment to your health goals. Please review the following plan we discussed and let me know if I can assist you in the future.   Screening recommendations/referrals: Colonoscopy: FOBT done 05/06/19. Please send it sample for this year.  Mammogram: done 12/31/12. Please call [phone] to schedule your mammogram.  Bone Density: done 06/01/19 Recommended yearly ophthalmology/optometry visit for glaucoma screening and checkup Recommended yearly dental visit for hygiene and checkup  Vaccinations: Influenza vaccine: done 10/29/19 Pneumococcal vaccine: done 02/21/16 Tdap vaccine: done 04/18/14 Shingles vaccine: Shingrix discussed. Please contact your pharmacy for coverage information.  Covid-19: done 08/20/19 & 09/17/19  Advanced directives: Advance directive discussed with you today. I have provided a copy for you to complete at home and have notarized. Once this is complete please bring a copy in to our office so we can scan it into your chart.  Conditions/risks identified: Recommend drinking 6-8 glasses of water per day   Next appointment: Follow up in one year for your annual wellness visit    Preventive Care 65 Years and Older, Female Preventive care refers to lifestyle choices and visits with your health care provider that can promote health and wellness. What does preventive care include?  A yearly physical exam. This is also called an annual well check.  Dental exams once or twice a year.  Routine eye exams. Ask your health care provider how often you should have your eyes checked.  Personal lifestyle choices, including:  Daily care of your teeth and gums.  Regular physical activity.  Eating a healthy diet.  Avoiding tobacco and drug use.  Limiting alcohol use.  Practicing safe sex.  Taking low-dose aspirin every day.  Taking vitamin and mineral supplements as  recommended by your health care provider. What happens during an annual well check? The services and screenings done by your health care provider during your annual well check will depend on your age, overall health, lifestyle risk factors, and family history of disease. Counseling  Your health care provider may ask you questions about your:  Alcohol use.  Tobacco use.  Drug use.  Emotional well-being.  Home and relationship well-being.  Sexual activity.  Eating habits.  History of falls.  Memory and ability to understand (cognition).  Work and work Astronomer.  Reproductive health. Screening  You may have the following tests or measurements:  Height, weight, and BMI.  Blood pressure.  Lipid and cholesterol levels. These may be checked every 5 years, or more frequently if you are over 73 years old.  Skin check.  Lung cancer screening. You may have this screening every year starting at age 31 if you have a 30-pack-year history of smoking and currently smoke or have quit within the past 15 years.  Fecal occult blood test (FOBT) of the stool. You may have this test every year starting at age 56.  Flexible sigmoidoscopy or colonoscopy. You may have a sigmoidoscopy every 5 years or a colonoscopy every 10 years starting at age 30.  Hepatitis C blood test.  Hepatitis B blood test.  Sexually transmitted disease (STD) testing.  Diabetes screening. This is done by checking your blood sugar (glucose) after you have not eaten for a while (fasting). You may have this done every 1-3 years.  Bone density scan. This is done to screen for osteoporosis. You may have this done starting at age 48.  Mammogram. This may be done  every 1-2 years. Talk to your health care provider about how often you should have regular mammograms. Talk with your health care provider about your test results, treatment options, and if necessary, the need for more tests. Vaccines  Your health care  provider may recommend certain vaccines, such as:  Influenza vaccine. This is recommended every year.  Tetanus, diphtheria, and acellular pertussis (Tdap, Td) vaccine. You may need a Td booster every 10 years.  Zoster vaccine. You may need this after age 39.  Pneumococcal 13-valent conjugate (PCV13) vaccine. One dose is recommended after age 40.  Pneumococcal polysaccharide (PPSV23) vaccine. One dose is recommended after age 107. Talk to your health care provider about which screenings and vaccines you need and how often you need them. This information is not intended to replace advice given to you by your health care provider. Make sure you discuss any questions you have with your health care provider. Document Released: 02/10/2015 Document Revised: 10/04/2015 Document Reviewed: 11/15/2014 Elsevier Interactive Patient Education  2017 [place] Prevention in the Home Falls can cause injuries. They can happen to people of all ages. There are many things you can do to make your home safe and to help prevent falls. What can I do on the outside of my home?  Regularly fix the edges of walkways and driveways and fix any cracks.  Remove anything that might make you trip as you walk through a door, such as a raised step or threshold.  Trim any bushes or trees on the path to your home.  Use bright outdoor lighting.  Clear any walking paths of anything that might make someone trip, such as rocks or tools.  Regularly check to see if handrails are loose or broken. Make sure that both sides of any steps have handrails.  Any raised decks and porches should have guardrails on the edges.  Have any leaves, snow, or ice cleared regularly.  Use sand or salt on walking paths during winter.  Clean up any spills in your garage right away. This includes oil or grease spills. What can I do in the bathroom?  Use night lights.  Install grab bars by the toilet and in the tub and shower. Do  not use towel bars as grab bars.  Use non-skid mats or decals in the tub or shower.  If you need to sit down in the shower, use a plastic, non-slip stool.  Keep the floor dry. Clean up any water that spills on the floor as soon as it happens.  Remove soap buildup in the tub or shower regularly.  Attach bath mats securely with double-sided non-slip rug tape.  Do not have throw rugs and other things on the floor that can make you trip. What can I do in the bedroom?  Use night lights.  Make sure that you have a light by your bed that is easy to reach.  Do not use any sheets or blankets that are too big for your bed. They should not hang down onto the floor.  Have a firm chair that has side arms. You can use this for support while you get dressed.  Do not have throw rugs and other things on the floor that can make you trip. What can I do in the kitchen?  Clean up any spills right away.  Avoid walking on wet floors.  Keep items that you use a lot in easy-to-reach places.  If you need to reach something above you, use a  strong step stool that has a grab bar.  Keep electrical cords out of the way.  Do not use floor polish or wax that makes floors slippery. If you must use wax, use non-skid floor wax.  Do not have throw rugs and other things on the floor that can make you trip. What can I do with my stairs?  Do not leave any items on the stairs.  Make sure that there are handrails on both sides of the stairs and use them. Fix handrails that are broken or loose. Make sure that handrails are as long as the stairways.  Check any carpeting to make sure that it is firmly attached to the stairs. Fix any carpet that is loose or worn.  Avoid having throw rugs at the top or bottom of the stairs. If you do have throw rugs, attach them to the floor with carpet tape.  Make sure that you have a light switch at the top of the stairs and the bottom of the stairs. If you do not have them,  ask someone to add them for you. What else can I do to help prevent falls?  Wear shoes that:  Do not have high heels.  Have rubber bottoms.  Are comfortable and fit you well.  Are closed at the toe. Do not wear sandals.  If you use a stepladder:  Make sure that it is fully opened. Do not climb a closed stepladder.  Make sure that both sides of the stepladder are locked into place.  Ask someone to hold it for you, if possible.  Clearly mark and make sure that you can see:  Any grab bars or handrails.  First and last steps.  Where the edge of each step is.  Use tools that help you move around (mobility aids) if they are needed. These include:  Canes.  Walkers.  Scooters.  Crutches.  Turn on the lights when you go into a dark area. Replace any light bulbs as soon as they burn out.  Set up your furniture so you have a clear path. Avoid moving your furniture around.  If any of your floors are uneven, fix them.  If there are any pets around you, be aware of where they are.  Review your medicines with your doctor. Some medicines can make you feel dizzy. This can increase your chance of falling. Ask your doctor what other things that you can do to help prevent falls. This information is not intended to replace advice given to you by your health care provider. Make sure you discuss any questions you have with your health care provider. Document Released: 11/10/2008 Document Revised: 06/22/2015 Document Reviewed: 02/18/2014 Elsevier Interactive Patient Education  2017 [employer].

## 2020-09-03 ENCOUNTER — Telehealth: Payer: Self-pay

## 2020-09-03 NOTE — Telephone Encounter (Signed)
Pt took rapid covid test which was positive. Pt stated her sx are improving. Pt has nasal congestion and tired. Pt stated that she feels better today than yesterday.  Advised pt to call her PCP for possible Paxlovid prescription. Treat sx with OTC meds. Do not use cough supressant (DM) for congested wet coughs. Can treat fever over 101 with Tylenol. Advised if SOB at rest and having difficulty breathing, to call 911. Encouraged pt to eat, drink and to monitor sx. Pt verbalized understanding.

## 2020-09-05 ENCOUNTER — Ambulatory Visit: Payer: Self-pay | Admitting: *Deleted

## 2020-09-05 NOTE — Telephone Encounter (Signed)
Called to reports she is positive for covid via at home test x 2. Patient reports symptoms started early morning of 09/02/20 Saturday. C/o chills , fever, aching. Now patient reports feeling better and has "stuffed up" nose. Hx COPD. Attempted to contact office yesterday and unable to reach anyone for help. Patient requesting antiviral medication due to hx COPD. Patient reports clear mucus at this time when blowing nose. Denies chest pain, difficulty breathing, cough, sore throat or fever. Reviewed isolation precautions for covid . Care advise given. Patient verbalized understanding of care advise and to call back or go to [facility] or ED if symptoms worsen. Patient requesting a call back on #[phone] .

## 2020-09-05 NOTE — Telephone Encounter (Signed)
Reason for Disposition  [1] HIGH RISK for severe COVID complications (e.g., weak immune system, age > 64 years, obesity with BMI > 25, pregnant, chronic lung disease or other chronic medical condition) AND [2] COVID symptoms (e.g., cough, fever)  (Exceptions: Already seen by PCP and no new or worsening symptoms.)  Answer Assessment - Initial Assessment Questions 1. COVID-19 DIAGNOSIS: "Who made your COVID-19 diagnosis?" "Was it confirmed by a positive lab test or self-test?" If not diagnosed by a doctor (or NP/PA), ask "Are there lots of cases (community spread) where you live?" Note: See public health department website, if unsure.     At home covid test positive on Sunday 09/03/20 2. COVID-19 EXPOSURE: "Was there any known exposure to COVID before the symptoms began?" CDC Definition of close contact: within 6 feet (2 meters) for a total of 15 minutes or more over a 24-hour period.      Na  3. ONSET: "When did the COVID-19 symptoms start?"      Saturday  4. WORST SYMPTOM: "What is your worst symptom?" (e.g., cough, fever, shortness of breath, muscle aches)     "Stuffed up "head 5. COUGH: "Do you have a cough?" If Yes, ask: "How bad is the cough?"       no 6. FEVER: "Do you have a fever?" If Yes, ask: "What is your temperature, how was it measured, and when did it start?"     na 7. RESPIRATORY STATUS: "Describe your breathing?" (e.g., shortness of breath, wheezing, unable to speak)      Na  8. BETTER-SAME-WORSE: "Are you getting better, staying the same or getting worse compared to yesterday?"  If getting worse, ask, "In what way?"     Better  9. HIGH RISK DISEASE: "Do you have any chronic medical problems?" (e.g., asthma, heart or lung disease, weak immune system, obesity, etc.)     COPD 10. VACCINE: "Have you had the COVID-19 vaccine?" If Yes, ask: "Which one, how many shots, when did you get it?"       Moderna x 2 shots  11. BOOSTER: "Have you received your COVID-19 booster?" If Yes, ask:  "Which one and when did you get it?"       No  12. PREGNANCY: "Is there any chance you are pregnant?" "When was your last menstrual period?"       na 13. OTHER SYMPTOMS: "Do you have any other symptoms?"  (e.g., chills, fatigue, headache, loss of smell or taste, muscle pain, sore throat)       Stuffed up nose  14. O2 SATURATION MONITOR:  "Do you use an oxygen saturation monitor (pulse oximeter) at home?" If Yes, ask "What is your reading (oxygen level) today?" "What is your usual oxygen saturation reading?" (e.g., 95%)       na  Protocols used: Coronavirus (COVID-19) Diagnosed or Suspected-A-AH

## 2020-09-06 ENCOUNTER — Telehealth: Payer: Self-pay

## 2020-09-06 ENCOUNTER — Telehealth: Payer: Self-pay | Admitting: *Deleted

## 2020-09-06 ENCOUNTER — Telehealth (INDEPENDENT_AMBULATORY_CARE_PROVIDER_SITE_OTHER): Payer: Medicare HMO | Admitting: Family Medicine

## 2020-09-06 ENCOUNTER — Encounter: Payer: Self-pay | Admitting: Family Medicine

## 2020-09-06 ENCOUNTER — Other Ambulatory Visit: Payer: Self-pay

## 2020-09-06 VITALS — Ht 62.0 in

## 2020-09-06 DIAGNOSIS — U071 COVID-19: Secondary | ICD-10-CM

## 2020-09-06 MED ORDER — MOLNUPIRAVIR EUA 200MG CAPSULE
4.0000 | ORAL_CAPSULE | Freq: Two times a day (BID) | ORAL | 0 refills | Status: AC
Start: 2020-09-06 — End: 2020-09-11

## 2020-09-06 NOTE — Telephone Encounter (Signed)
Pt calling on 'ChatBox' line. States has been trying to reach practice. Pt expresses frustration.   Reports tested positive for covid Sunday. Symptoms started Saturday 09/02/20. See previous encounters. States is interested in oral anti-viral meds. Reports "Feeling better" stuffiness and decreased energy symptoms presently. NT called practice, Marchelle Folks, will route to practice for review.   Pt aware. CB# [phone]

## 2020-09-06 NOTE — Patient Instructions (Signed)
-   Dose antiviral medication twice daily x5 days - Continue your current medications (inhalers in particular) - Can continue vapor usage for congestion, if congestion persists use Flonase and saline - Stay hydrated and get plenty of rest - Avoid exposing others x 5 days if you are fever-free and symptoms are improving - Contact our office if symptoms are not improving by next week

## 2020-09-06 NOTE — Assessment & Plan Note (Signed)
Patient with initial onset of congestion, headache 3 days prior, denies any specific sick contacts, no fevers, chills, no difficulty breathing. She took two separate home tests at onset which came back positive for COVID-19. She has not left her home and has been maintaining her regular medications and dosing Tylenol for infrequent headache and home vapor treatments for nasal congestion which she feels helps greatly.  Examination limited due to virtual appointment but she is speaking calmly without pause or need for frequent breaths, no coughs noted.  Given her risk factors inclusive of COPD, plan for molnupiravir prescription agreed upon in addition to supportive care. She is to contact us next week for any failure to improve. The duration of some post-COVID symptoms reviewed and she vocalizes understanding.

## 2020-09-06 NOTE — Telephone Encounter (Signed)
Patient added to the schedule for virtual visit at 11:40 AM.  Please advise if you are able to send in an antiviral medication.

## 2020-09-06 NOTE — Progress Notes (Signed)
Primary Care / Sports Medicine Virtual Visit  Patient Information:  Patient ID: Lori Lambert, female DOB: 1945-09-12 Age: 75 y.o. MRN: [id number]   Lori Lambert is a pleasant 75 y.o. female presenting with the following:  Chief Complaint  Patient presents with   Covid Positive    Risk factor 3 positive for covid via at home test x 2 on 8/7. C/o chills , fever, aching, stuffy nose, no fever, no SOB Hx COPD    Review of Systems: No fevers, chills, night sweats, weight loss, chest pain, or shortness of breath.   Patient Active Problem List   Diagnosis Date Noted   COVID-19 09/06/2020   Age-related osteoporosis without current pathological fracture 04/20/2020   Pulmonary emphysema (HCC) 04/20/2020   Aortic atherosclerosis (HCC) 04/20/2020   Proximal humerus fracture 04/20/2019   Bilateral renal cysts 03/05/2019   Prediabetes 10/20/2017   Tobacco use disorder, moderate, in sustained remission 10/20/2017   Allergic rhinitis 04/17/2017   Vitamin D deficiency 02/22/2016   Family history of cancer of the kidney 05/12/2014   Arthritis of knee, degenerative 05/12/2014   Sleep disorder 06/28/2013   Past Medical History:  Diagnosis Date   COPD (chronic obstructive pulmonary disease) (HCC)    Outpatient Encounter Medications as of 09/06/2020  Medication Sig   Acetaminophen (TYLENOL PO) Take by mouth.   albuterol (VENTOLIN HFA) 108 (90 Base) MCG/ACT inhaler Inhale 2 puffs into the lungs every 4 (four) hours as needed.   alendronate (FOSAMAX) 70 MG tablet Take 1 tablet (70 mg total) by mouth every 7 (seven) days. Take with a full glass of water on an empty stomach.   atorvastatin (LIPITOR) 10 MG tablet Take 1 tablet (10 mg total) by mouth daily.   baclofen (LIORESAL) 10 MG tablet TAKE 1 TABLET(10 MG) BY MOUTH AT BEDTIME AS NEEDED FOR MUSCLE SPASMS   Cholecalciferol (VITAMIN D3) 5000 units TABS Take by mouth.   molnupiravir EUA 200 mg CAPS Take 4 capsules (800 mg total) by  mouth 2 (two) times daily for 5 days.   nystatin (MYCOSTATIN) 100000 UNIT/ML suspension Take 5 mLs by mouth 4 (four) times daily.   SYMBICORT 160-4.5 MCG/ACT inhaler INHALE 2 PUFFS INTO THE LUNGS TWICE DAILY   No facility-administered encounter medications on file as of 09/06/2020.   Past Surgical History:  Procedure Laterality Date   ABDOMINAL HYSTERECTOMY     bladder tack  2005   ORIF FOREARM FRACTURE Right 2011    Virtual Visit via MyChart Video:   I connected with Lori Lambert on 09/06/20 via MyChart Video and verified that I am speaking with the correct Lori Lambert using appropriate identifiers.   The limitations, risks, security and privacy concerns of performing an evaluation and management service by MyChart Video, including the higher likelihood of inaccurate diagnoses and treatments, and the availability of in Lori Lambert appointments were reviewed. The possible need of an additional face-to-face encounter for complete and high quality delivery of care was discussed. The patient was also made aware that there may be a patient responsible charge related to this service. The patient expressed understanding and wishes to proceed.  Provider location is in medical facility. Patient location is at their home, different from provider location. People involved in care of the patient during this telehealth encounter were myself, my nurse/medical assistant, and my front office/scheduling team member.  Objective findings:   General: Speaking full sentences, no audible heavy breathing. Sounds alert and appropriately interactive.  Independent interpretation of  notes and tests performed by another provider:   None  Pertinent History, Exam, Impression, and Recommendations:   COVID-19 Patient with initial onset of congestion, headache 3 days prior, denies any specific sick contacts, no fevers, chills, no difficulty breathing. She took two separate home tests at onset which came back positive  for COVID-19. She has not left her home and has been maintaining her regular medications and dosing Tylenol for infrequent headache and home vapor treatments for nasal congestion which she feels helps greatly.  Examination limited due to virtual appointment but she is speaking calmly without pause or need for frequent breaths, no coughs noted.  Given her risk factors inclusive of COPD, plan for molnupiravir prescription agreed upon in addition to supportive care. She is to contact us next week for any failure to improve. The duration of some post-COVID symptoms reviewed and she vocalizes understanding.  Orders & Medications Meds ordered this encounter  Medications   molnupiravir EUA 200 mg CAPS    Sig: Take 4 capsules (800 mg total) by mouth 2 (two) times daily for 5 days.    Dispense:  40 capsule    Refill:  0   No orders of the defined types were placed in this encounter.    I discussed the above assessment and treatment plan with the patient. The patient was provided an opportunity to ask questions and all were answered. The patient agreed with the plan and demonstrated an understanding of the instructions.   The patient was advised to call back or seek an in-[facility] evaluation if the symptoms worsen or if the condition fails to improve as anticipated.   I provided a total time of 30 minutes including both face-to-face and non-face-to-face time on 09/06/2020 inclusive of time utilized for medical chart review, information gathering, care coordination with staff, and documentation completion.    Jerrol Banana, MD   Primary Care Sports Medicine [facility]

## 2020-09-06 NOTE — Telephone Encounter (Signed)

## 2020-09-21 ENCOUNTER — Telehealth: Payer: Self-pay

## 2020-09-21 DIAGNOSIS — J439 Emphysema, unspecified: Secondary | ICD-10-CM

## 2020-09-21 MED ORDER — ALBUTEROL SULFATE HFA 108 (90 BASE) MCG/ACT IN AERS: 2.0000 | INHALATION_SPRAY | RESPIRATORY_TRACT | 1 refills | Status: DC | PRN

## 2020-09-21 NOTE — Telephone Encounter (Signed)
Called and gave patient # to call and schedule her mammogram.  

## 2020-11-22 ENCOUNTER — Other Ambulatory Visit: Payer: Self-pay | Admitting: Internal Medicine

## 2020-11-22 ENCOUNTER — Telehealth: Payer: Self-pay | Admitting: Internal Medicine

## 2020-11-22 MED ORDER — NYSTATIN 100000 UNIT/ML MT SUSP: 5.0000 mL | Freq: Four times a day (QID) | OROMUCOSAL | 5 refills | Status: DC

## 2020-11-22 NOTE — Telephone Encounter (Signed)
For your information. Please advise if indicated.

## 2020-11-22 NOTE — Telephone Encounter (Signed)
Please advise for refill.  Last filled 04/2019 by Historical Provider per chart.

## 2020-11-22 NOTE — Telephone Encounter (Signed)
Please call patient to schedule CPE in March per Dr. Judithann Graves and also see why she needs the nystatin oral suspension.  Thank you!

## 2020-11-22 NOTE — Telephone Encounter (Signed)
PATIENT, Lori Lambert ( 09-17-1945)  WALKED IN TODAY TO SCHEDULE A FLU SHOT AND TO SEE IF SHE COULD GET AN REFILL ON HER MEDICATION: NYSTATIN ORAL SUSP.   ADVISED PATIENT THAT SOMEONE WILL BE IN TOUCH TO LET HER KNOW IF IT WILL BE REFILLED OR NOT.   Dr. Judithann Graves Patient

## 2020-11-23 ENCOUNTER — Other Ambulatory Visit: Payer: Self-pay

## 2020-11-23 ENCOUNTER — Ambulatory Visit (INDEPENDENT_AMBULATORY_CARE_PROVIDER_SITE_OTHER): Payer: Medicare HMO

## 2020-11-23 DIAGNOSIS — Z23 Encounter for immunization: Secondary | ICD-10-CM

## 2021-01-22 ENCOUNTER — Other Ambulatory Visit: Payer: Self-pay | Admitting: Internal Medicine

## 2021-01-22 DIAGNOSIS — J449 Chronic obstructive pulmonary disease, unspecified: Secondary | ICD-10-CM

## 2021-01-23 NOTE — Telephone Encounter (Signed)
Requested Prescriptions  Pending Prescriptions Disp Refills   SYMBICORT 160-4.5 MCG/ACT inhaler [Pharmacy Med Name: SYMBICORT 160/4. (120 ORAL INH)] 30.6 g 5    Sig: INHALE 2 PUFFS INTO THE LUNGS TWICE DAILY     Pulmonology:  Combination Products Passed - 01/22/2021  3:34 AM      Passed - Valid encounter within last 12 months    Recent Outpatient Visits          4 months ago COVID-19   [facility] Jerrol Banana, MD   9 months ago Annual physical exam   [facility] Reubin Milan, MD   1 year ago Annual physical exam   [facility] Reubin Milan, MD   2 years ago Annual physical exam   [facility] Reubin Milan, MD   3 years ago Ovarian failure   [facility] Reubin Milan, MD      Future Appointments            In 2 months Judithann Graves Nyoka Cowden, MD [facility]

## 2021-02-09 ENCOUNTER — Other Ambulatory Visit: Payer: Self-pay

## 2021-02-09 DIAGNOSIS — J449 Chronic obstructive pulmonary disease, unspecified: Secondary | ICD-10-CM

## 2021-02-09 MED ORDER — SYMBICORT 160-4.5 MCG/ACT IN AERO: 2.0000 | INHALATION_SPRAY | Freq: Two times a day (BID) | RESPIRATORY_TRACT | 2 refills | Status: DC

## 2021-03-02 ENCOUNTER — Other Ambulatory Visit: Payer: Self-pay

## 2021-03-02 DIAGNOSIS — J449 Chronic obstructive pulmonary disease, unspecified: Secondary | ICD-10-CM

## 2021-03-02 MED ORDER — SYMBICORT 160-4.5 MCG/ACT IN AERO: 2.0000 | INHALATION_SPRAY | Freq: Two times a day (BID) | RESPIRATORY_TRACT | 1 refills | Status: DC

## 2021-03-05 ENCOUNTER — Telehealth: Payer: Self-pay

## 2021-03-05 NOTE — Telephone Encounter (Signed)
PA completed with secondary insurance. Waiting for insurance approval.  Key: [id number]  KP

## 2021-03-05 NOTE — Telephone Encounter (Signed)
PA denied. Pt has another insurance card will complete PA with 2nd insurance.  KP

## 2021-03-05 NOTE — Telephone Encounter (Signed)
PA completed waiting on insurance approval.  Key: [id number]  KP

## 2021-03-07 ENCOUNTER — Telehealth: Payer: Self-pay | Admitting: Internal Medicine

## 2021-03-07 NOTE — Telephone Encounter (Signed)
Called pt to let her know [employer] withdrew appeal that symbicort is now available. That pt should be able to get her inhaler.  PEC nurse may give results to patient if they return call to clinic, a CRM has been created.  KP

## 2021-03-07 NOTE — Telephone Encounter (Signed)
Lori Lambert from [employer], says they are withdrawing appeal and  symbacort  is now availabale

## 2021-03-08 NOTE — Telephone Encounter (Signed)
Appeal was cancelled and approved from [facility]  KP

## 2021-04-05 ENCOUNTER — Ambulatory Visit (INDEPENDENT_AMBULATORY_CARE_PROVIDER_SITE_OTHER): Payer: Medicare HMO | Admitting: Internal Medicine

## 2021-04-05 ENCOUNTER — Encounter: Payer: Self-pay | Admitting: Internal Medicine

## 2021-04-05 ENCOUNTER — Other Ambulatory Visit: Payer: Self-pay

## 2021-04-05 VITALS — BP 124/80 | HR 74 | Ht 62.0 in | Wt 126.8 lb

## 2021-04-05 DIAGNOSIS — Z0000 Encounter for general adult medical examination without abnormal findings: Secondary | ICD-10-CM

## 2021-04-05 DIAGNOSIS — J439 Emphysema, unspecified: Secondary | ICD-10-CM | POA: Diagnosis not present

## 2021-04-05 DIAGNOSIS — R7303 Prediabetes: Secondary | ICD-10-CM | POA: Diagnosis not present

## 2021-04-05 DIAGNOSIS — M810 Age-related osteoporosis without current pathological fracture: Secondary | ICD-10-CM

## 2021-04-05 DIAGNOSIS — Z1211 Encounter for screening for malignant neoplasm of colon: Secondary | ICD-10-CM

## 2021-04-05 DIAGNOSIS — I700 Atherosclerosis of aorta: Secondary | ICD-10-CM | POA: Diagnosis not present

## 2021-04-05 DIAGNOSIS — G479 Sleep disorder, unspecified: Secondary | ICD-10-CM

## 2021-04-05 DIAGNOSIS — E559 Vitamin D deficiency, unspecified: Secondary | ICD-10-CM | POA: Diagnosis not present

## 2021-04-05 DIAGNOSIS — Z1231 Encounter for screening mammogram for malignant neoplasm of breast: Secondary | ICD-10-CM

## 2021-04-05 MED ORDER — SYMBICORT 160-4.5 MCG/ACT IN AERO: 2.0000 | INHALATION_SPRAY | Freq: Two times a day (BID) | RESPIRATORY_TRACT | 3 refills | Status: DC

## 2021-04-05 MED ORDER — ALENDRONATE SODIUM 70 MG PO TABS: 70.0000 mg | ORAL_TABLET | ORAL | 3 refills | Status: DC

## 2021-04-05 MED ORDER — ATORVASTATIN CALCIUM 10 MG PO TABS: 10.0000 mg | ORAL_TABLET | Freq: Every day | ORAL | 3 refills | Status: DC

## 2021-04-05 NOTE — Progress Notes (Signed)
Date:  4/0/9811   Name:  Lori Lambert   DOB:  [birth year]   MRN:  [id number]   Chief Complaint: Annual Exam (Breast exam no pap ) Lori Lambert is a 76 y.o. female who presents today for her Complete Annual Exam. She feels well. She reports exercising. She reports she is sleeping poorly- left leg and ankle pains. Breast complaints none.  Mammogram: not since 2009 DEXA: 05/2019 osteoporosis Colonoscopy: FIT 04/2019  Immunization History  Administered Date(s) Administered   Fluad Quad(high Dose 65+) 11/23/2020   Influenza, High Dose Seasonal PF 10/20/2017   Influenza, Seasonal, Injecte, Preservative Fre 01/03/2009, 02/27/2011, 10/28/2012   Influenza-Unspecified 10/29/2018, 10/29/2019   Moderna Sars-Covid-2 Vaccination 08/20/2019, 09/17/2019   Pneumococcal Conjugate-13 02/21/2016   Pneumococcal Polysaccharide-23 02/01/2011   Tdap 04/18/2014    Insomnia Primary symptoms: no sleep disturbance, difficulty falling asleep, frequent awakening.   The problem occurs nightly. The problem is unchanged. Treatments tried: melatonin and simply sleep. Duration of naps:  One to two hours.   Hyperlipidemia This is a chronic problem. The problem is controlled. Pertinent negatives include no chest pain or shortness of breath. Current antihyperlipidemic treatment includes statins. The current treatment provides significant improvement of lipids.   Lab Results  Component Value Date   NA 145 (H) 04/20/2020   K 4.2 04/20/2020   CO2 24 04/20/2020   GLUCOSE 107 (H) 04/20/2020   BUN 12 04/20/2020   CREATININE 1.01 (H) 04/20/2020   CALCIUM 9.6 04/20/2020   EGFR 58 (L) 04/20/2020   GFRNONAA 71 04/20/2019   Lab Results  Component Value Date   CHOL 214 (H) 04/20/2020   HDL 56 04/20/2020   LDLCALC 131 (H) 04/20/2020   TRIG 152 (H) 04/20/2020   CHOLHDL 3.8 04/20/2020   Lab Results  Component Value Date   TSH 2.320 04/20/2020   Lab Results  Component Value Date   HGBA1C 6.0 (H)  04/20/2020   Lab Results  Component Value Date   WBC 8.8 04/20/2019   HGB 14.5 04/20/2019   HCT 43.0 04/20/2019   MCV 96 04/20/2019   PLT 362 04/20/2019   Lab Results  Component Value Date   ALT 14 04/20/2020   AST 20 04/20/2020   ALKPHOS 80 04/20/2020   BILITOT 0.3 04/20/2020   Lab Results  Component Value Date   VD25OH 38.0 04/20/2019     Review of Systems  Constitutional:  Negative for chills, fatigue and fever.  HENT:  Negative for congestion, hearing loss, tinnitus, trouble swallowing and voice change.   Eyes:  Negative for visual disturbance.  Respiratory:  Negative for cough, chest tightness, shortness of breath and wheezing.   Cardiovascular:  Negative for chest pain, palpitations and leg swelling.  Gastrointestinal:  Negative for abdominal pain, constipation, diarrhea and vomiting.  Endocrine: Negative for polydipsia and polyuria.  Genitourinary:  Negative for dysuria, frequency, genital sores, vaginal bleeding and vaginal discharge.  Musculoskeletal:  Negative for arthralgias, gait problem and joint swelling.  Skin:  Negative for color change and rash.  Neurological:  Negative for dizziness, tremors, light-headedness and headaches.  Hematological:  Negative for adenopathy. Does not bruise/bleed easily.  Psychiatric/Behavioral:  Negative for dysphoric mood and sleep disturbance. The patient has insomnia. The patient is not nervous/anxious.    Patient Active Problem List   Diagnosis Date Noted   Age-related osteoporosis without current pathological fracture 04/20/2020   Pulmonary emphysema ([place]) 04/20/2020   Aortic atherosclerosis ([place]) 04/20/2020   Proximal humerus fracture 04/20/2019  Bilateral renal cysts 03/05/2019   Prediabetes 10/20/2017   Tobacco use disorder, moderate, in sustained remission 10/20/2017   Allergic rhinitis 04/17/2017   Vitamin D deficiency 02/22/2016   Family history of cancer of the kidney 05/12/2014   Arthritis of knee, degenerative  05/12/2014   Sleep disorder 06/28/2013    Allergies  Allergen Reactions   Sulfa Antibiotics     Past Surgical History:  Procedure Laterality Date   ABDOMINAL HYSTERECTOMY     bladder tack  2005   ORIF FOREARM FRACTURE Right 2011    Social History   Tobacco Use   Smoking status: Former    Packs/day: 1.00    Years: 40.00    Pack years: 40.00    Types: Cigarettes    Quit date: 07/13/2014    Years since quitting: 6.7   Smokeless tobacco: Never  Vaping Use   Vaping Use: Never used  Substance Use Topics   Alcohol use: No   Drug use: No     Medication list has been reviewed and updated.  Current Meds  Medication Sig   Acetaminophen (TYLENOL PO) Take by mouth.   albuterol (VENTOLIN HFA) 108 (90 Base) MCG/ACT inhaler Inhale 2 puffs into the lungs every 4 (four) hours as needed.   alendronate (FOSAMAX) 70 MG tablet Take 1 tablet (70 mg total) by mouth every 7 (seven) days. Take with a full glass of water on an empty stomach.   atorvastatin (LIPITOR) 10 MG tablet Take 1 tablet (10 mg total) by mouth daily.   baclofen (LIORESAL) 10 MG tablet TAKE 1 TABLET(10 MG) BY MOUTH AT BEDTIME AS NEEDED FOR MUSCLE SPASMS   Cholecalciferol (VITAMIN D3) 5000 units TABS Take by mouth.   nystatin (MYCOSTATIN) 100000 UNIT/ML suspension Take 5 mLs (500,000 Units total) by mouth 4 (four) times daily.   SYMBICORT 160-4.5 MCG/ACT inhaler Inhale 2 puffs into the lungs 2 (two) times daily.    PHQ 2/9 Scores 04/05/2021 09/06/2020 05/22/2020 04/20/2020  PHQ - 2 Score 0 0 0 0  PHQ- 9 Score 3 2 - 2    GAD 7 : Generalized Anxiety Score 04/05/2021 09/06/2020 04/20/2020 04/20/2019  Nervous, Anxious, on Edge 0 0 0 0  Control/stop worrying 0 0 0 0  Worry too much - different things 0 0 0 0  Trouble relaxing 0 0 0 1  Restless 0 0 0 0  Easily annoyed or irritable 0 0 0 0  Afraid - awful might happen 0 0 0 0  Total GAD 7 Score 0 0 0 1  Anxiety Difficulty Not difficult at all - - Not difficult at all    BP  Readings from Last 3 Encounters:  04/05/21 124/80  05/22/20 100/70  04/20/20 130/74    Physical Exam Vitals and nursing note reviewed.  Constitutional:      General: She is not in acute distress.    Appearance: She is well-developed.  HENT:     Head: Normocephalic and atraumatic.     Right Ear: Tympanic membrane and ear canal normal.     Left Ear: Tympanic membrane and ear canal normal.     Nose:     Right Sinus: No maxillary sinus tenderness.     Left Sinus: No maxillary sinus tenderness.  Eyes:     General: No scleral icterus.       Right eye: No discharge.        Left eye: No discharge.     Conjunctiva/sclera: Conjunctivae normal.  Neck:  Thyroid: No thyromegaly.     Vascular: No carotid bruit.  Cardiovascular:     Rate and Rhythm: Normal rate and regular rhythm.     Pulses: Normal pulses.     Heart sounds: Normal heart sounds.  Pulmonary:     Effort: Pulmonary effort is normal. No respiratory distress.     Breath sounds: No wheezing.  Chest:  Breasts:    Right: No mass, nipple discharge, skin change or tenderness.     Left: No mass, nipple discharge, skin change or tenderness.  Abdominal:     General: Bowel sounds are normal.     Palpations: Abdomen is soft.     Tenderness: There is no abdominal tenderness.  Musculoskeletal:     Cervical back: Normal range of motion. No erythema.     Right lower leg: No edema.     Left lower leg: No edema.  Lymphadenopathy:     Cervical: No cervical adenopathy.  Skin:    General: Skin is warm and dry.     Findings: No rash.  Neurological:     Mental Status: She is alert and oriented to person, place, and time.     Cranial Nerves: No cranial nerve deficit.     Sensory: No sensory deficit.     Deep Tendon Reflexes: Reflexes are normal and symmetric.  Psychiatric:        Attention and Perception: Attention normal.        Mood and Affect: Mood normal.    Wt Readings from Last 3 Encounters:  04/05/21 126 lb 12.8 oz (57.5  kg)  05/22/20 115 lb 6.4 oz (52.3 kg)  04/20/20 114 lb (51.7 kg)    BP 124/80    Pulse 74    Ht '5\' 2"'  (1.575 m)    Wt 126 lb 12.8 oz (57.5 kg)    SpO2 97%    BMI 23.19 kg/m   Assessment and Plan: 1. Annual physical exam Normal exam. Continue healthy diet and regular physical activity. Up to date on screenings and immunizations.  2. Age-related osteoporosis without current pathological fracture Doing well on Fosamax.  Continue weekly dosing with vitamin D. DEXA next year. - CBC with Differential/Platelet - TSH - alendronate (FOSAMAX) 70 MG tablet; Take 1 tablet (70 mg total) by mouth every 7 (seven) days. Take with a full glass of water on an empty stomach.  Dispense: 12 tablet; Refill: 3  3. Aortic atherosclerosis (H[facility]) On appropriate statin therapy - Lipid panel - atorvastatin (LIPITOR) 10 MG tablet; Take 1 tablet (10 mg total) by mouth daily.  Dispense: 90 tablet; Refill: 3  4. Prediabetes Check labs; continue healthy diet  - Comprehensive metabolic panel - Hemoglobin A1c  5. Vitamin D deficiency Continue daily supplement - check levels - VITAMIN D 25 Hydroxy (Vit-D Deficiency, Fractures)  6. Pulmonary emphysema, unspecified emphysema type ([place]) Stable pulmonary symptoms on Symbicort bid. Continue current therapy. - SYMBICORT 160-4.5 MCG/ACT inhaler; Inhale 2 puffs into the lungs 2 (two) times daily.  Dispense: 30.6 g; Refill: 3  7. Colon cancer screening - Fecal occult blood, imunochemical  8. Encounter for screening mammogram for breast cancer Schedule at [facility] - MM 3D SCREEN BREAST BILATERAL  9. Sleep disorder Samples of Belsomra 15 mg given. Failed melatonin and benadryl   Partially dictated using Editor, commissioning. Any errors are unintentional.  Halina Maidens, MD [facility]  04/05/2021

## 2021-04-07 LAB — COMPREHENSIVE METABOLIC PANEL
ALT: 15 IU/L (ref 0–32)
AST: 20 IU/L (ref 0–40)
Albumin/Globulin Ratio: 1.8 (ref 1.2–2.2)
Albumin: 4.4 g/dL (ref 3.7–4.7)
Alkaline Phosphatase: 79 IU/L (ref 44–121)
BUN/Creatinine Ratio: 15 (ref 12–28)
BUN: 12 mg/dL (ref 8–27)
Bilirubin Total: 0.3 mg/dL (ref 0.0–1.2)
CO2: 25 mmol/L (ref 20–29)
Calcium: 9.9 mg/dL (ref 8.7–10.3)
Chloride: 102 mmol/L (ref 96–106)
Creatinine, Ser: 0.81 mg/dL (ref 0.57–1.00)
Globulin, Total: 2.4 g/dL (ref 1.5–4.5)
Glucose: 75 mg/dL (ref 70–99)
Potassium: 4.3 mmol/L (ref 3.5–5.2)
Sodium: 143 mmol/L (ref 134–144)
Total Protein: 6.8 g/dL (ref 6.0–8.5)
eGFR: 76 mL/min/{1.73_m2} (ref >59)

## 2021-04-07 LAB — CBC WITH DIFFERENTIAL/PLATELET
Basophils Absolute: 0.1 10*3/uL (ref 0.0–0.2)
Basos: 1 %
EOS (ABSOLUTE): 0.4 10*3/uL (ref 0.0–0.4)
Eos: 4 %
Hematocrit: 43.2 % (ref 34.0–46.6)
Hemoglobin: 14.2 g/dL (ref 11.1–15.9)
Immature Grans (Abs): 0 10*3/uL (ref 0.0–0.1)
Immature Granulocytes: 0 %
Lymphocytes Absolute: 3.5 10*3/uL — ABNORMAL HIGH (ref 0.7–3.1)
Lymphs: 37 %
MCH: 30.7 pg (ref 26.6–33.0)
MCHC: 32.9 g/dL (ref 31.5–35.7)
MCV: 94 fL (ref 79–97)
Monocytes Absolute: 0.6 10*3/uL (ref 0.1–0.9)
Monocytes: 7 %
Neutrophils Absolute: 4.7 10*3/uL (ref 1.4–7.0)
Neutrophils: 51 %
Platelets: 330 10*3/uL (ref 150–450)
RBC: 4.62 x10E6/uL (ref 3.77–5.28)
RDW: 12.5 % (ref 11.7–15.4)
WBC: 9.3 10*3/uL (ref 3.4–10.8)

## 2021-04-07 LAB — LIPID PANEL
Chol/HDL Ratio: 2.9 ratio (ref 0.0–4.4)
Cholesterol, Total: 169 mg/dL (ref 100–199)
HDL: 59 mg/dL (ref >39)
LDL Chol Calc (NIH): 90 mg/dL (ref 0–99)
Triglycerides: 109 mg/dL (ref 0–149)
VLDL Cholesterol Cal: 20 mg/dL (ref 5–40)

## 2021-04-07 LAB — VITAMIN D 25 HYDROXY (VIT D DEFICIENCY, FRACTURES): Vit D, 25-Hydroxy: 38.7 ng/mL (ref 30.0–100.0)

## 2021-04-07 LAB — TSH: TSH: 2.87 u[IU]/mL (ref 0.450–4.500)

## 2021-04-07 LAB — HEMOGLOBIN A1C
Est. average glucose Bld gHb Est-mCnc: 128 mg/dL
Hgb A1c MFr Bld: 6.1 % — ABNORMAL HIGH (ref 4.8–5.6)

## 2021-04-11 ENCOUNTER — Telehealth: Payer: Self-pay

## 2021-04-11 NOTE — Telephone Encounter (Signed)
Called pt as a reminder to call and schedule mammogram. Could not leave VM. P: [phone] ? ?[facility] nurse may give results to patient if they return call to clinic, a CRM has been created. ? ?KP ?

## 2021-04-20 ENCOUNTER — Other Ambulatory Visit: Payer: Self-pay | Admitting: *Deleted

## 2021-04-20 DIAGNOSIS — Z87891 Personal history of nicotine dependence: Secondary | ICD-10-CM

## 2021-04-27 ENCOUNTER — Ambulatory Visit
Admission: RE | Admit: 2021-04-27 | Discharge: 2021-04-27 | Disposition: A | Discharge status: Home / Self Care | Payer: Medicare HMO | Source: Ambulatory Visit | Attending: Internal Medicine | Admitting: Internal Medicine

## 2021-04-27 DIAGNOSIS — Z87891 Personal history of nicotine dependence: Secondary | ICD-10-CM | POA: Diagnosis not present

## 2021-04-30 ENCOUNTER — Other Ambulatory Visit: Payer: Self-pay | Admitting: Acute Care

## 2021-04-30 DIAGNOSIS — Z87891 Personal history of nicotine dependence: Secondary | ICD-10-CM

## 2021-05-23 ENCOUNTER — Ambulatory Visit (INDEPENDENT_AMBULATORY_CARE_PROVIDER_SITE_OTHER): Payer: Medicare HMO

## 2021-05-23 VITALS — BP 126/78 | HR 65 | Resp 15 | Ht 62.0 in | Wt 127.4 lb

## 2021-05-23 DIAGNOSIS — Z780 Asymptomatic menopausal state: Secondary | ICD-10-CM

## 2021-05-23 DIAGNOSIS — Z0000 Encounter for general adult medical examination without abnormal findings: Secondary | ICD-10-CM

## 2021-05-23 NOTE — Patient Instructions (Signed)
Lori Lambert , ?Thank you for taking time to come for your Medicare Wellness Visit. I appreciate your ongoing commitment to your health goals. Please review the following plan we discussed and let me know if I can assist you in the future.  ? ?Screening recommendations/referrals: ?Colonoscopy: no longer required ?Mammogram: done 2014. Please call [phone] to schedule your mammogram and bone density screening ?Bone Density: done 06/01/19 ?Recommended yearly ophthalmology/optometry visit for glaucoma screening and checkup ?Recommended yearly dental visit for hygiene and checkup ? ?Vaccinations: ?Influenza vaccine: done 11/23/20 ?Pneumococcal vaccine: done 02/21/16 ?Tdap vaccine: done 04/18/14 ?Shingles vaccine: Shingrix discussed. Please contact your pharmacy for coverage information.  ?Covid-19:done 08/20/19 & 09/17/19 ? ?Advanced directives: Advance directive discussed with you today. I have provided a copy for you to complete at home and have notarized. Once this is complete please bring a copy in to our office so we can scan it into your chart.  ? ?Conditions/risks identified: Recommend increasing physical activity  ? ?Next appointment: Follow up in one year for your annual wellness visit  ? ? ?Preventive Care 58 Years and Older, Female ?Preventive care refers to lifestyle choices and visits with your health care provider that can promote health and wellness. ?What does preventive care include? ?A yearly physical exam. This is also called an annual well check. ?Dental exams once or twice a year. ?Routine eye exams. Ask your health care provider how often you should have your eyes checked. ?Personal lifestyle choices, including: ?Daily care of your teeth and gums. ?Regular physical activity. ?Eating a healthy diet. ?Avoiding tobacco and drug use. ?Limiting alcohol use. ?Practicing safe sex. ?Taking low-dose aspirin every day. ?Taking vitamin and mineral supplements as recommended by your health care provider. ?What  happens during an annual well check? ?The services and screenings done by your health care provider during your annual well check will depend on your age, overall health, lifestyle risk factors, and family history of disease. ?Counseling  ?Your health care provider may ask you questions about your: ?Alcohol use. ?Tobacco use. ?Drug use. ?Emotional well-being. ?Home and relationship well-being. ?Sexual activity. ?Eating habits. ?History of falls. ?Memory and ability to understand (cognition). ?Work and work Astronomer. ?Reproductive health. ?Screening  ?You may have the following tests or measurements: ?Height, weight, and BMI. ?Blood pressure. ?Lipid and cholesterol levels. These may be checked every 5 years, or more frequently if you are over 94 years old. ?Skin check. ?Lung cancer screening. You may have this screening every year starting at age 65 if you have a 30-pack-year history of smoking and currently smoke or have quit within the past 15 years. ?Fecal occult blood test (FOBT) of the stool. You may have this test every year starting at age 70. ?Flexible sigmoidoscopy or colonoscopy. You may have a sigmoidoscopy every 5 years or a colonoscopy every 10 years starting at age 25. ?Hepatitis C blood test. ?Hepatitis B blood test. ?Sexually transmitted disease (STD) testing. ?Diabetes screening. This is done by checking your blood sugar (glucose) after you have not eaten for a while (fasting). You may have this done every 1-3 years. ?Bone density scan. This is done to screen for osteoporosis. You may have this done starting at age 53. ?Mammogram. This may be done every 1-2 years. Talk to your health care provider about how often you should have regular mammograms. ?Talk with your health care provider about your test results, treatment options, and if necessary, the need for more tests. ?Vaccines  ?Your health care provider may recommend  certain vaccines, such as: ?Influenza vaccine. This is recommended every  year. ?Tetanus, diphtheria, and acellular pertussis (Tdap, Td) vaccine. You may need a Td booster every 10 years. ?Zoster vaccine. You may need this after age 67. ?Pneumococcal 13-valent conjugate (PCV13) vaccine. One dose is recommended after age 8. ?Pneumococcal polysaccharide (PPSV23) vaccine. One dose is recommended after age 23. ?Talk to your health care provider about which screenings and vaccines you need and how often you need them. ?This information is not intended to replace advice given to you by your health care provider. Make sure you discuss any questions you have with your health care provider. ?Document Released: 02/10/2015 Document Revised: 10/04/2015 Document Reviewed: 11/15/2014 ?Elsevier Interactive Patient Education ? 2017 [place]. ? ?Fall Prevention in the Home ?Falls can cause injuries. They can happen to people of all ages. There are many things you can do to make your home safe and to help prevent falls. ?What can I do on the outside of my home? ?Regularly fix the edges of walkways and driveways and fix any cracks. ?Remove anything that might make you trip as you walk through a door, such as a raised step or threshold. ?Trim any bushes or trees on the path to your home. ?Use bright outdoor lighting. ?Clear any walking paths of anything that might make someone trip, such as rocks or tools. ?Regularly check to see if handrails are loose or broken. Make sure that both sides of any steps have handrails. ?Any raised decks and porches should have guardrails on the edges. ?Have any leaves, snow, or ice cleared regularly. ?Use sand or salt on walking paths during winter. ?Clean up any spills in your garage right away. This includes oil or grease spills. ?What can I do in the bathroom? ?Use night lights. ?Install grab bars by the toilet and in the tub and shower. Do not use towel bars as grab bars. ?Use non-skid mats or decals in the tub or shower. ?If you need to sit down in the shower, use a  plastic, non-slip stool. ?Keep the floor dry. Clean up any water that spills on the floor as soon as it happens. ?Remove soap buildup in the tub or shower regularly. ?Attach bath mats securely with double-sided non-slip rug tape. ?Do not have throw rugs and other things on the floor that can make you trip. ?What can I do in the bedroom? ?Use night lights. ?Make sure that you have a light by your bed that is easy to reach. ?Do not use any sheets or blankets that are too big for your bed. They should not hang down onto the floor. ?Have a firm chair that has side arms. You can use this for support while you get dressed. ?Do not have throw rugs and other things on the floor that can make you trip. ?What can I do in the kitchen? ?Clean up any spills right away. ?Avoid walking on wet floors. ?Keep items that you use a lot in easy-to-reach places. ?If you need to reach something above you, use a strong step stool that has a grab bar. ?Keep electrical cords out of the way. ?Do not use floor polish or wax that makes floors slippery. If you must use wax, use non-skid floor wax. ?Do not have throw rugs and other things on the floor that can make you trip. ?What can I do with my stairs? ?Do not leave any items on the stairs. ?Make sure that there are handrails on both sides of the  stairs and use them. Fix handrails that are broken or loose. Make sure that handrails are as long as the stairways. ?Check any carpeting to make sure that it is firmly attached to the stairs. Fix any carpet that is loose or worn. ?Avoid having throw rugs at the top or bottom of the stairs. If you do have throw rugs, attach them to the floor with carpet tape. ?Make sure that you have a light switch at the top of the stairs and the bottom of the stairs. If you do not have them, ask someone to add them for you. ?What else can I do to help prevent falls? ?Wear shoes that: ?Do not have high heels. ?Have rubber bottoms. ?Are comfortable and fit you  well. ?Are closed at the toe. Do not wear sandals. ?If you use a stepladder: ?Make sure that it is fully opened. Do not climb a closed stepladder. ?Make sure that both sides of the stepladder are locked into place. ?

## 2021-05-23 NOTE — Progress Notes (Signed)
? ?Subjective:  ? Lori Lambert is a 76 y.o. female who presents for Medicare Annual (Subsequent) preventive examination. ? ?Review of Systems    ? ?Cardiac Risk Factors include: advanced age (>33men, >61 women);dyslipidemia ? ?   ?Objective:  ?  ?Today's Vitals  ? 05/23/21 1456  ?BP: 126/78  ?Pulse: 65  ?Resp: 15  ?SpO2: 95%  ?Weight: 127 lb 6.4 oz (57.8 kg)  ?Height: 5\' 2"  (1.575 m)  ? ?Body mass index is 23.3 kg/m?. ? ? ?  05/23/2021  ?  3:02 PM 05/22/2020  ?  3:36 PM 05/17/2019  ?  3:32 PM 08/21/2016  ?  3:37 PM 02/21/2016  ?  8:55 AM  ?Advanced Directives  ?Does Patient Have a Medical Advance Directive? No No No No No  ?Would patient like information on creating a medical advance directive? Yes (MAU/Ambulatory/Procedural Areas - Information given) Yes (MAU/Ambulatory/Procedural Areas - Information given) Yes (MAU/Ambulatory/Procedural Areas - Information given)    ? ? ?Current Medications (verified) ?Outpatient Encounter Medications as of 05/23/2021  ?Medication Sig  ? Acetaminophen (TYLENOL PO) Take by mouth.  ? albuterol (VENTOLIN HFA) 108 (90 Base) MCG/ACT inhaler Inhale 2 puffs into the lungs every 4 (four) hours as needed.  ? alendronate (FOSAMAX) 70 MG tablet Take 1 tablet (70 mg total) by mouth every 7 (seven) days. Take with a full glass of water on an empty stomach.  ? atorvastatin (LIPITOR) 10 MG tablet Take 1 tablet (10 mg total) by mouth daily.  ? baclofen (LIORESAL) 10 MG tablet TAKE 1 TABLET(10 MG) BY MOUTH AT BEDTIME AS NEEDED FOR MUSCLE SPASMS  ? Cholecalciferol (VITAMIN D3) 5000 units TABS Take by mouth.  ? nystatin (MYCOSTATIN) 100000 UNIT/ML suspension Take 5 mLs (500,000 Units total) by mouth 4 (four) times daily.  ? SYMBICORT 160-4.5 MCG/ACT inhaler Inhale 2 puffs into the lungs 2 (two) times daily.  ? ?No facility-administered encounter medications on file as of 05/23/2021.  ? ? ?Allergies (verified) ?Sulfa antibiotics  ? ?History: ?Past Medical History:  ?Diagnosis Date  ? COPD (chronic  obstructive pulmonary disease) (HCC)   ? Osteoporosis   ? ?Past Surgical History:  ?Procedure Laterality Date  ? ABDOMINAL HYSTERECTOMY    ? bladder tack  2005  ? ORIF FOREARM FRACTURE Right 2011  ? ?Family History  ?Problem Relation Age of Onset  ? Heart attack Mother   ? Heart attack Father   ? Renal cancer Sister   ? ?Social History  ? ?Socioeconomic History  ? Marital status: Single  ?  Spouse name: Not on file  ? Number of children: 2  ? Years of education: Not on file  ? Highest education level: Not on file  ?Occupational History  ? Not on file  ?Tobacco Use  ? Smoking status: Former  ?  Packs/day: 1.00  ?  Years: 40.00  ?  Pack years: 40.00  ?  Types: Cigarettes  ?  Quit date: 07/13/2014  ?  Years since quitting: 6.8  ? Smokeless tobacco: Never  ?Vaping Use  ? Vaping Use: Never used  ?Substance and Sexual Activity  ? Alcohol use: No  ? Drug use: No  ? Sexual activity: Not on file  ?Other Topics Concern  ? Not on file  ?Social History Narrative  ? Pt's daughter lives with her.   ? ?Social Determinants of Health  ? ?Financial Resource Strain: Low Risk   ? Difficulty of Paying Living Expenses: Not hard at all  ?Food Insecurity: No Food  Insecurity  ? Worried About Programme researcher, broadcasting/film/video in the Last Year: Never true  ? Ran Out of Food in the Last Year: Never true  ?Transportation Needs: No Transportation Needs  ? Lack of Transportation (Medical): No  ? Lack of Transportation (Non-Medical): No  ?Physical Activity: Inactive  ? Days of Exercise per Week: 0 days  ? Minutes of Exercise per Session: 0 min  ?Stress: No Stress Concern Present  ? Feeling of Stress : Not at all  ?Social Connections: Socially Isolated  ? Frequency of Communication with Friends and Family: More than three times a week  ? Frequency of Social Gatherings with Friends and Family: More than three times a week  ? Attends Religious Services: Never  ? Active Member of Clubs or Organizations: No  ? Attends Banker Meetings: Never  ? Marital  Status: Widowed  ? ? ?Tobacco Counseling ?Counseling given: Not Answered ? ? ?Clinical Intake: ? ?Pre-visit preparation completed: Yes ? ?Pain : No/denies pain ? ?  ? ?BMI - recorded: 23.3 ?Nutritional Status: BMI of 19-24  Normal ?Nutritional Risks: None ?Diabetes: No ? ?How often do you need to have someone help you when you read instructions, pamphlets, or other written materials from your doctor or pharmacy?: 1 - Never ? ? ?Interpreter Needed?: No ? ?Information entered by :: Reather [name] LPN ? ? ?Activities of Daily Living ? ?  05/23/2021  ?  3:04 PM 04/05/2021  ? 10:30 AM  ?In your present state of health, do you have any difficulty performing the following activities:  ?Hearing? 0 0  ?Vision? 0 0  ?Difficulty concentrating or making decisions? 0 0  ?Walking or climbing stairs? 0 0  ?Dressing or bathing? 0 0  ?Doing errands, shopping? 0 0  ?Preparing Food and eating ? N   ?Using the Toilet? N   ?In the past six months, have you accidently leaked urine? N   ?Do you have problems with loss of bowel control? N   ?Managing your Medications? N   ?Managing your Finances? N   ?Housekeeping or managing your Housekeeping? N   ? ? ?Patient Care Team: ?Reubin Milan, MD as PCP - General (Internal Medicine) ? ?Indicate any recent Medical Services you may have received from other than [facility] providers in the past year (date may be approximate). ? ?   ?Assessment:  ? This is a routine wellness examination for Memori. ? ?Hearing/Vision screen ?Hearing Screening - Comments:: Pt denies hearing difficulty ?Vision Screening - Comments:: Annual vision screenings done by Dr. Clearance Coots at [facility]. ? ?Dietary issues and exercise activities discussed: ?Current Exercise Habits: The patient does not participate in regular exercise at present, Exercise limited by: None identified ? ? Goals Addressed   ? ?  ?  ?  ?  ? This Visit's Progress  ?  DIET - INCREASE WATER INTAKE   Not on track  ?  Recommend drinking 6-8 glasses of water per day ? ?   ? ?  ? ?Depression Screen ? ?  05/23/2021  ?  3:01 PM 04/05/2021  ? 10:30 AM 09/06/2020  ? 10:47 AM 05/22/2020  ?  3:35 PM 04/20/2020  ?  8:22 AM 05/17/2019  ?  3:30 PM 04/20/2019  ?  8:19 AM  ?PHQ 2/9 Scores  ?PHQ - 2 Score 0 0 0 0 0 0 0  ?PHQ- 9 Score 1 3 2  2  4   ?  ?Fall Risk ? ?  05/23/2021  ?  3:03 PM 04/05/2021  ? 10:30 AM 09/06/2020  ? 10:47 AM 05/22/2020  ?  3:37 PM 04/20/2020  ?  8:22 AM  ?Fall Risk   ?Falls in the past year? 0 0 0 0 1  ?Number falls in past yr: 0 0 0 0 0  ?Injury with Fall? 0 0 0 0 1  ?Risk for fall due to : No Fall Risks No Fall Risks No Fall Risks No Fall Risks   ?Follow up Falls prevention discussed Falls evaluation completed Falls evaluation completed Falls prevention discussed Falls evaluation completed  ? ? ?FALL RISK PREVENTION PERTAINING TO THE HOME: ? ?Any stairs in or around the home? Yes  ?If so, are there any without handrails? No  ?Home free of loose throw rugs in walkways, pet beds, electrical cords, etc? Yes  ?Adequate lighting in your home to reduce risk of falls? Yes  ? ?ASSISTIVE DEVICES UTILIZED TO PREVENT FALLS: ? ?Life alert? No  ?Use of a cane, walker or w/c? No  ?Grab bars in the bathroom? Yes  ?Shower chair or bench in shower? Yes  ?Elevated toilet seat or a handicapped toilet? No  ? ?TIMED UP AND GO: ? ?Was the test performed? Yes .  ?Length of time to ambulate 10 feet: 5 sec.  ? ?Gait steady and fast without use of assistive device ? ?Cognitive Function: Normal cognitive status assessed by direct observation by this Nurse Health Advisor. No abnormalities found.  ? ?  ?  ?  ? ?Immunizations ?Immunization History  ?Administered Date(s) Administered  ? Fluad Quad(high Dose 65+) 11/23/2020  ? Influenza, High Dose Seasonal PF 10/20/2017  ? Influenza, Seasonal, Injecte, Preservative Fre 01/03/2009, 02/27/2011, 10/28/2012  ? Influenza-Unspecified 10/29/2018, 10/29/2019  ? Moderna Sars-Covid-2 Vaccination 08/20/2019, 09/17/2019  ? Pneumococcal Conjugate-13 02/21/2016  ? Pneumococcal  Polysaccharide-23 02/01/2011  ? Tdap 04/18/2014  ? ? ?TDAP status: Up to date ? ?Flu Vaccine status: Up to date ? ?Pneumococcal vaccine status: Up to date ? ?Covid-19 vaccine status: Completed vaccines ?

## 2021-06-29 ENCOUNTER — Other Ambulatory Visit: Payer: Self-pay

## 2021-06-29 ENCOUNTER — Encounter: Payer: Self-pay | Admitting: Emergency Medicine

## 2021-06-29 ENCOUNTER — Ambulatory Visit (INDEPENDENT_AMBULATORY_CARE_PROVIDER_SITE_OTHER): Payer: Medicare HMO

## 2021-06-29 ENCOUNTER — Ambulatory Visit
Admission: EM | Admit: 2021-06-29 | Discharge: 2021-06-29 | Disposition: A | Discharge status: Home / Self Care | Payer: Medicare HMO | Attending: Emergency Medicine | Admitting: Emergency Medicine

## 2021-06-29 DIAGNOSIS — M898X2 Other specified disorders of bone, upper arm: Secondary | ICD-10-CM

## 2021-06-29 DIAGNOSIS — M25511 Pain in right shoulder: Secondary | ICD-10-CM

## 2021-06-29 DIAGNOSIS — R600 Localized edema: Secondary | ICD-10-CM | POA: Diagnosis not present

## 2021-06-29 NOTE — Discharge Instructions (Addendum)
Your x-ray was negative for any acute fracture, however, I wonder if you have a stress fracture that is not showing up on x-ray.  You do have some soft tissue swelling over the area of pain.  I would advise to start using your sling again to help rest it, but take your shoulder out frequently to move it around to prevent it from becoming frozen.  You may take 400 mg of ibuprofen with 1000 mg of Tylenol 3 times a day as needed for pain.  Ice or heat, whichever feels better.  Please follow-up with your orthopedic surgeon or with Dr. Nyquan Selbe Royalty, sports medicine, in a week if not better with conservative treatment

## 2021-06-29 NOTE — ED Triage Notes (Signed)
Patient states that she fractured her right humerus a year ago.  Patient states last Friday she went to put a dish away and felt a sharp pain in her right upper arm.  Patient has been taking some Advil and Tylenol with no relief.

## 2021-06-29 NOTE — ED Provider Notes (Signed)
HPI  SUBJECTIVE:  Lori Lambert is a 76 y.o. female who presents with a pop followed by constant, daily, stabbing pain in her right mid humerus sustained 6 days ago while trying to put a dish away into a drying rack.  She denies direct trauma.  She states it is in the exact same area where she broke it 2 years ago.  It was left to heal on its own in a sling.  It did not require surgery.  She reports some swelling.  No erythema, bruising, deformity, distal numbness or tingling, grip weakness.  She has tried Voltaren, IcyHot, Biofreeze, Salonpas, Advil 400 mg every 4 hours alternating days with Tylenol 1000 mg every 4 hours.  The Tylenol and ibuprofen helped for about 10 minutes.  Symptoms are worse with any movement.  She has a past medical history of osteoporosis, right humeral shaft fracture, COPD.  No history of chronic kidney disease, anticoagulant, antiplatelet use, diabetes, hypertension.  PCP [place] [facility].  Orthopedics: In [place]   Past Medical History:  Diagnosis Date   COPD (chronic obstructive pulmonary disease) (HCC)    Osteoporosis     Past Surgical History:  Procedure Laterality Date   ABDOMINAL HYSTERECTOMY     bladder tack  2005   ORIF FOREARM FRACTURE Right 2011    Family History  Problem Relation Age of Onset   Heart attack Mother    Heart attack Father    Renal cancer Sister     Social History   Tobacco Use   Smoking status: Former    Packs/day: 1.00    Years: 40.00    Pack years: 40.00    Types: Cigarettes    Quit date: 07/13/2014    Years since quitting: 6.9   Smokeless tobacco: Never  Vaping Use   Vaping Use: Never used  Substance Use Topics   Alcohol use: No   Drug use: No    No current facility-administered medications for this encounter.  Current Outpatient Medications:    alendronate (FOSAMAX) 70 MG tablet, Take 1 tablet (70 mg total) by mouth every 7 (seven) days. Take with a full glass of water on an empty stomach., Disp: 12  tablet, Rfl: 3   atorvastatin (LIPITOR) 10 MG tablet, Take 1 tablet (10 mg total) by mouth daily., Disp: 90 tablet, Rfl: 3   Cholecalciferol (VITAMIN D3) 5000 units TABS, Take by mouth., Disp: , Rfl:    SYMBICORT 160-4.5 MCG/ACT inhaler, Inhale 2 puffs into the lungs 2 (two) times daily., Disp: 30.6 g, Rfl: 3   Acetaminophen (TYLENOL PO), Take by mouth., Disp: , Rfl:    albuterol (VENTOLIN HFA) 108 (90 Base) MCG/ACT inhaler, Inhale 2 puffs into the lungs every 4 (four) hours as needed., Disp: 1 each, Rfl: 1   baclofen (LIORESAL) 10 MG tablet, TAKE 1 TABLET(10 MG) BY MOUTH AT BEDTIME AS NEEDED FOR MUSCLE SPASMS, Disp: 30 tablet, Rfl: 0   nystatin (MYCOSTATIN) 100000 UNIT/ML suspension, Take 5 mLs (500,000 Units total) by mouth 4 (four) times daily., Disp: 60 mL, Rfl: 5  Allergies  Allergen Reactions   Sulfa Antibiotics      ROS  As noted in HPI.   Physical Exam  BP (!) 141/60 (BP Location: Left Arm)   Pulse 82   Temp 98.1 F (36.7 C) (Oral)   Resp 14   Ht 5\' 2"  (1.575 m)   Wt 57.2 kg   SpO2 97%   BMI 23.05 kg/m   Constitutional: Well developed, well  nourished, no acute distress Eyes:  EOMI, conjunctiva normal bilaterally HENT: Normocephalic, atraumatic,mucus membranes moist Respiratory: Normal inspiratory effort Cardiovascular: Normal rate GI: nondistended skin: No rash, skin intact Musculoskeletal: no deformities. R  shoulder with ROM severely limited, clavicle NT, A/C joint NT , scapula NT, proximal humerus NT, midshaft humerus tender.  No overlying erythema, edema, bruising.  Trapezius  NT, shoulder joint NT, Motor strength decreased at shoulder due to pain, Sensation intact LT over deltoid region, distal NVI with hand having intact sensation and strength in the median, radial, and ulnar nerve distribution.   Pain  with internal rotation, pain  with external rotation, Positive tenderness in bicipital groove, empty can test, liftoff test deferred. RP 2+  Neurologic: Alert &  oriented x 3, no focal neuro deficits Psychiatric: Speech and behavior appropriate   ED Course   Medications - No data to display  Orders Placed This Encounter  Procedures   DG Humerus Right    Standing Status:   Standing    Number of Occurrences:   1    Order Specific Question:   Reason for Exam (SYMPTOM  OR DIAGNOSIS REQUIRED)    Answer:   History of mid humeral fracture, osteoporosis.  Atraumatic pop followed by pain and tenderness along the mid humerus 6 days ago rule out fracture    No results found for this or any previous visit (from the past 24 hour(s)). DG Humerus Right  Result Date: 06/29/2021 CLINICAL DATA:  Patient had a right shoulder pop followed by pain and tenderness along the mid uterus 6 days ago. No specific injury. Prior humeral fracture. EXAM: RIGHT HUMERUS - 2+ VIEW COMPARISON:  03/24/2019 FINDINGS: Residual deformity from the healed proximal right humeral fracture. No acute fracture. No bone lesion. Skeletal structures are demineralized. Glenohumeral and elbow joints are normally aligned. Distal arm soft tissue edema. IMPRESSION: 1. No acute fracture, bone lesion or dislocation. Previous proximal humeral fracture has healed. 2. Mild distal arm soft tissue edema. Electronically Signed   By: Amie Portland M.D.   On: 06/29/2021 13:10    ED Clinical Impression  1. Pain of right humerus      ED Assessment/Plan  Concern for recurrent humeral fracture given direct bony tenderness and history of osteoporosis.  Imaging humerus.  Deferring imaging of the shoulder as she has no shoulder tenderness.  Deferring range of motion testing until x-ray is completed.  Reviewed imaging independently.  No acute fracture, bone lesion or dislocation.  Previous proximal humeral fracture is healed.  Mild distal arm soft tissue edema.  See radiology report for full details.  X-ray negative for acute fracture.  She may have a small stress fracture, due to the osteoporosis however.  Will  advise a sling for the next few days to help rest it, but have her take her shoulder out for frequent movement.  States she does not need a sling.  Tylenol/ibuprofen together 3 times a day, follow-up with her orthopedic surgeon or with Dr. Jerred Zaremba Royalty, sports medicine in a week.  Discussed imaging, MDM, treatment plan, and plan for follow-up with patient. patient agrees with plan.   No orders of the defined types were placed in this encounter.     *This clinic note was created using Dragon dictation software. Therefore, there may be occasional mistakes despite careful proofreading.  ?    Domenick Gong, MD 06/29/21 1346

## 2021-07-16 ENCOUNTER — Ambulatory Visit
Admission: RE | Admit: 2021-07-16 | Discharge: 2021-07-16 | Disposition: A | Discharge status: Home / Self Care | Payer: Medicare HMO | Source: Ambulatory Visit | Attending: Internal Medicine | Admitting: Internal Medicine

## 2021-07-16 DIAGNOSIS — Z1231 Encounter for screening mammogram for malignant neoplasm of breast: Secondary | ICD-10-CM | POA: Diagnosis not present

## 2021-07-16 DIAGNOSIS — Z780 Asymptomatic menopausal state: Secondary | ICD-10-CM | POA: Diagnosis not present

## 2021-07-16 DIAGNOSIS — M8588 Other specified disorders of bone density and structure, other site: Secondary | ICD-10-CM | POA: Diagnosis not present

## 2021-07-16 DIAGNOSIS — M810 Age-related osteoporosis without current pathological fracture: Secondary | ICD-10-CM | POA: Diagnosis not present

## 2021-07-17 ENCOUNTER — Ambulatory Visit (INDEPENDENT_AMBULATORY_CARE_PROVIDER_SITE_OTHER): Payer: Medicare HMO | Admitting: Internal Medicine

## 2021-07-17 ENCOUNTER — Encounter: Payer: Self-pay | Admitting: Internal Medicine

## 2021-07-17 VITALS — BP 130/78 | HR 102 | Ht 62.0 in | Wt 129.0 lb

## 2021-07-17 DIAGNOSIS — M79621 Pain in right upper arm: Secondary | ICD-10-CM

## 2021-07-17 NOTE — Progress Notes (Signed)
Date:  9/52/8413   Name:  Lori Lambert   DOB:  1945/03/31   MRN:  [id number]   Chief Complaint: No chief complaint on file.  Shoulder Pain  The pain is present in the right shoulder. This is a new problem. Episode onset: on 06/29/21. The problem occurs constantly. The problem has been unchanged. Pertinent negatives include no fever or numbness.  She reached up into a cabinet and felt a pop in the upper right arm.  She took tylenol and rested without improvement.  One week later went to ED - xrays were negative for fracture.  She was instructed to take Aleve and follow up with Ortho.  Aleve has been of no benefit.  She tried to do some exercises from her humerus fracture but they were too painful. She is here to discuss next steps.  Lab Results  Component Value Date   NA 143 04/05/2021   K 4.3 04/05/2021   CO2 25 04/05/2021   GLUCOSE 75 04/05/2021   BUN 12 04/05/2021   CREATININE 0.81 04/05/2021   CALCIUM 9.9 04/05/2021   EGFR 76 04/05/2021   GFRNONAA 71 04/20/2019   Lab Results  Component Value Date   CHOL 169 04/05/2021   HDL 59 04/05/2021   LDLCALC 90 04/05/2021   TRIG 109 04/05/2021   CHOLHDL 2.9 04/05/2021   Lab Results  Component Value Date   TSH 2.870 04/05/2021   Lab Results  Component Value Date   HGBA1C 6.1 (H) 04/05/2021   Lab Results  Component Value Date   WBC 9.3 04/05/2021   HGB 14.2 04/05/2021   HCT 43.2 04/05/2021   MCV 94 04/05/2021   PLT 330 04/05/2021   Lab Results  Component Value Date   ALT 15 04/05/2021   AST 20 04/05/2021   ALKPHOS 79 04/05/2021   BILITOT 0.3 04/05/2021   Lab Results  Component Value Date   VD25OH 38.7 04/05/2021     Review of Systems  Constitutional:  Negative for chills, fatigue and fever.  Respiratory:  Negative for chest tightness.   Cardiovascular:  Negative for chest pain and palpitations.  Musculoskeletal:  Positive for arthralgias and myalgias.  Neurological:  Negative for dizziness, weakness,  numbness and headaches.    Patient Active Problem List   Diagnosis Date Noted   Age-related osteoporosis without current pathological fracture 04/20/2020   Pulmonary emphysema ([place]) 04/20/2020   Aortic atherosclerosis ([place]) 04/20/2020   Proximal humerus fracture 04/20/2019   Bilateral renal cysts 03/05/2019   Prediabetes 10/20/2017   Tobacco use disorder, moderate, in sustained remission 10/20/2017   Allergic rhinitis 04/17/2017   Vitamin D deficiency 02/22/2016   Family history of cancer of the kidney 05/12/2014   Arthritis of knee, degenerative 05/12/2014   Sleep disorder 06/28/2013    Allergies  Allergen Reactions   Sulfa Antibiotics     Past Surgical History:  Procedure Laterality Date   ABDOMINAL HYSTERECTOMY     bladder tack  2005   ORIF FOREARM FRACTURE Right 2011    Social History   Tobacco Use   Smoking status: Former    Packs/day: 1.00    Years: 40.00    Total pack years: 40.00    Types: Cigarettes    Quit date: 07/13/2014    Years since quitting: 7.0   Smokeless tobacco: Never  Vaping Use   Vaping Use: Never used  Substance Use Topics   Alcohol use: No   Drug use: No     Medication  list has been reviewed and updated.  Current Meds  Medication Sig   Acetaminophen (TYLENOL PO) Take by mouth.   albuterol (VENTOLIN HFA) 108 (90 Base) MCG/ACT inhaler Inhale 2 puffs into the lungs every 4 (four) hours as needed.   alendronate (FOSAMAX) 70 MG tablet Take 1 tablet (70 mg total) by mouth every 7 (seven) days. Take with a full glass of water on an empty stomach.   atorvastatin (LIPITOR) 10 MG tablet Take 1 tablet (10 mg total) by mouth daily.   baclofen (LIORESAL) 10 MG tablet TAKE 1 TABLET(10 MG) BY MOUTH AT BEDTIME AS NEEDED FOR MUSCLE SPASMS   Cholecalciferol (VITAMIN D3) 5000 units TABS Take by mouth.   nystatin (MYCOSTATIN) 100000 UNIT/ML suspension Take 5 mLs (500,000 Units total) by mouth 4 (four) times daily.   SYMBICORT 160-4.5 MCG/ACT inhaler  Inhale 2 puffs into the lungs 2 (two) times daily.       07/17/2021    1:19 PM 04/05/2021   10:30 AM 09/06/2020   10:48 AM 04/20/2020    8:22 AM  GAD 7 : Generalized Anxiety Score  Nervous, Anxious, on Edge 0 0 0 0  Control/stop worrying 0 0 0 0  Worry too much - different things 0 0 0 0  Trouble relaxing 0 0 0 0  Restless 0 0 0 0  Easily annoyed or irritable 0 0 0 0  Afraid - awful might happen 0 0 0 0  Total GAD 7 Score 0 0 0 0  Anxiety Difficulty Not difficult at all Not difficult at all         07/17/2021    1:19 PM  Depression screen PHQ 2/9  Decreased Interest 0  Down, Depressed, Hopeless 0  PHQ - 2 Score 0  Altered sleeping 3  Tired, decreased energy 2  Change in appetite 0  Feeling bad or failure about yourself  0  Trouble concentrating 0  Moving slowly or fidgety/restless 0  Suicidal thoughts 0  PHQ-9 Score 5  Difficult doing work/chores Not difficult at all    BP Readings from Last 3 Encounters:  07/17/21 130/78  06/29/21 (!) 141/60  05/23/21 126/78    Physical Exam Vitals and nursing note reviewed.  Constitutional:      General: She is not in acute distress.    Appearance: She is well-developed.  HENT:     Head: Normocephalic and atraumatic.  Cardiovascular:     Rate and Rhythm: Normal rate and regular rhythm.  Pulmonary:     Effort: Pulmonary effort is normal. No respiratory distress.     Breath sounds: No wheezing or rhonchi.  Musculoskeletal:     Right shoulder: Swelling (minimal swelling triceps region - no bruising) present. No bony tenderness or crepitus. Decreased range of motion (minimal movement in the shoulder due to pain). Normal strength. Normal pulse.     Left shoulder: Normal.     Cervical back: Normal range of motion.  Lymphadenopathy:     Cervical: No cervical adenopathy.  Skin:    General: Skin is warm and dry.     Findings: No rash.  Neurological:     Mental Status: She is alert and oriented to person, place, and time.   Psychiatric:        Mood and Affect: Mood normal.        Behavior: Behavior normal.     Wt Readings from Last 3 Encounters:  07/17/21 129 lb (58.5 kg)  06/29/21 126 lb (57.2 kg)  05/23/21  127 lb 6.4 oz (57.8 kg)    BP 130/78   Pulse (!) 102   Ht '5\' 2"'  (1.575 m)   Wt 129 lb (58.5 kg)   SpO2 97%   BMI 23.59 kg/m   Assessment and Plan: 1. Pain in right upper arm Concern for partial tendon rupture with ongoing pain  Continue Aleve and modified activities Urgent Ortho evaluation - Ambulatory referral to Orthopedic Surgery   Partially dictated using [place]. Any errors are unintentional.  Halina Maidens, MD [facility]  07/17/2021

## 2021-07-18 ENCOUNTER — Other Ambulatory Visit: Payer: Self-pay | Admitting: Internal Medicine

## 2021-07-18 DIAGNOSIS — R921 Mammographic calcification found on diagnostic imaging of breast: Secondary | ICD-10-CM

## 2021-07-18 DIAGNOSIS — R928 Other abnormal and inconclusive findings on diagnostic imaging of breast: Secondary | ICD-10-CM

## 2021-07-23 DIAGNOSIS — M19111 Post-traumatic osteoarthritis, right shoulder: Secondary | ICD-10-CM | POA: Diagnosis not present

## 2021-07-23 DIAGNOSIS — M12811 Other specific arthropathies, not elsewhere classified, right shoulder: Secondary | ICD-10-CM | POA: Diagnosis not present

## 2021-07-23 DIAGNOSIS — M7501 Adhesive capsulitis of right shoulder: Secondary | ICD-10-CM | POA: Diagnosis not present

## 2021-07-27 ENCOUNTER — Ambulatory Visit
Admission: RE | Admit: 2021-07-27 | Discharge: 2021-07-27 | Disposition: A | Discharge status: Home / Self Care | Payer: Medicare HMO | Source: Ambulatory Visit | Attending: Internal Medicine | Admitting: Internal Medicine

## 2021-07-27 DIAGNOSIS — R921 Mammographic calcification found on diagnostic imaging of breast: Secondary | ICD-10-CM | POA: Insufficient documentation

## 2021-07-27 DIAGNOSIS — R928 Other abnormal and inconclusive findings on diagnostic imaging of breast: Secondary | ICD-10-CM | POA: Diagnosis not present

## 2021-07-27 DIAGNOSIS — R922 Inconclusive mammogram: Secondary | ICD-10-CM | POA: Diagnosis not present

## 2021-09-24 ENCOUNTER — Other Ambulatory Visit: Payer: Self-pay

## 2021-09-24 MED ORDER — NYSTATIN 100000 UNIT/ML MT SUSP: 5.0000 mL | Freq: Four times a day (QID) | OROMUCOSAL | 5 refills | Status: DC

## 2021-12-04 ENCOUNTER — Ambulatory Visit (INDEPENDENT_AMBULATORY_CARE_PROVIDER_SITE_OTHER): Payer: Medicare HMO

## 2021-12-04 DIAGNOSIS — Z23 Encounter for immunization: Secondary | ICD-10-CM | POA: Diagnosis not present

## 2021-12-31 DIAGNOSIS — M19111 Post-traumatic osteoarthritis, right shoulder: Secondary | ICD-10-CM | POA: Diagnosis not present

## 2022-01-23 DIAGNOSIS — M7581 Other shoulder lesions, right shoulder: Secondary | ICD-10-CM | POA: Diagnosis not present

## 2022-01-23 DIAGNOSIS — S42201D Unspecified fracture of upper end of right humerus, subsequent encounter for fracture with routine healing: Secondary | ICD-10-CM | POA: Diagnosis not present

## 2022-01-23 DIAGNOSIS — M19111 Post-traumatic osteoarthritis, right shoulder: Secondary | ICD-10-CM | POA: Diagnosis not present

## 2022-01-24 ENCOUNTER — Other Ambulatory Visit: Payer: Self-pay | Admitting: Surgery

## 2022-01-24 DIAGNOSIS — M7581 Other shoulder lesions, right shoulder: Secondary | ICD-10-CM

## 2022-01-24 DIAGNOSIS — S42202S Unspecified fracture of upper end of left humerus, sequela: Secondary | ICD-10-CM

## 2022-01-24 DIAGNOSIS — M19111 Post-traumatic osteoarthritis, right shoulder: Secondary | ICD-10-CM

## 2022-02-04 ENCOUNTER — Other Ambulatory Visit: Payer: Self-pay | Admitting: Surgery

## 2022-02-05 ENCOUNTER — Ambulatory Visit
Admission: RE | Admit: 2022-02-05 | Discharge: 2022-02-05 | Disposition: A | Discharge status: Home / Self Care | Payer: Medicare HMO | Source: Ambulatory Visit | Attending: Surgery | Admitting: Surgery

## 2022-02-05 DIAGNOSIS — M19111 Post-traumatic osteoarthritis, right shoulder: Secondary | ICD-10-CM

## 2022-02-05 DIAGNOSIS — M7581 Other shoulder lesions, right shoulder: Secondary | ICD-10-CM | POA: Diagnosis not present

## 2022-02-05 DIAGNOSIS — M19011 Primary osteoarthritis, right shoulder: Secondary | ICD-10-CM | POA: Diagnosis not present

## 2022-02-05 DIAGNOSIS — S43031A Inferior subluxation of right humerus, initial encounter: Secondary | ICD-10-CM | POA: Diagnosis not present

## 2022-02-05 DIAGNOSIS — S42202S Unspecified fracture of upper end of left humerus, sequela: Secondary | ICD-10-CM | POA: Insufficient documentation

## 2022-02-14 ENCOUNTER — Encounter
Admission: RE | Admit: 2022-02-14 | Discharge: 2022-02-14 | Disposition: A | Discharge status: Home / Self Care | Payer: Medicare HMO | Source: Ambulatory Visit | Attending: Surgery | Admitting: Surgery

## 2022-02-14 DIAGNOSIS — I498 Other specified cardiac arrhythmias: Secondary | ICD-10-CM | POA: Insufficient documentation

## 2022-02-14 DIAGNOSIS — Z01818 Encounter for other preprocedural examination: Secondary | ICD-10-CM | POA: Diagnosis not present

## 2022-02-14 DIAGNOSIS — I700 Atherosclerosis of aorta: Secondary | ICD-10-CM | POA: Diagnosis not present

## 2022-02-14 DIAGNOSIS — M12811 Other specific arthropathies, not elsewhere classified, right shoulder: Secondary | ICD-10-CM | POA: Diagnosis not present

## 2022-02-14 DIAGNOSIS — Z01812 Encounter for preprocedural laboratory examination: Secondary | ICD-10-CM

## 2022-02-14 DIAGNOSIS — S42202D Unspecified fracture of upper end of left humerus, subsequent encounter for fracture with routine healing: Secondary | ICD-10-CM | POA: Diagnosis not present

## 2022-02-14 DIAGNOSIS — M19111 Post-traumatic osteoarthritis, right shoulder: Secondary | ICD-10-CM | POA: Diagnosis not present

## 2022-02-14 HISTORY — DX: Prediabetes: R73.03

## 2022-02-14 HISTORY — DX: Pneumonia, unspecified organism: J18.9

## 2022-02-14 HISTORY — DX: Pure hypercholesterolemia, unspecified: E78.00

## 2022-02-14 HISTORY — DX: Family history of other specified conditions: Z84.89

## 2022-02-14 HISTORY — DX: Atherosclerosis of aorta: I70.0

## 2022-02-14 LAB — CBC WITH DIFFERENTIAL/PLATELET
Abs Immature Granulocytes: 0.03 10*3/uL (ref 0.00–0.07)
Basophils Absolute: 0.1 10*3/uL (ref 0.0–0.1)
Basophils Relative: 1 %
Eosinophils Absolute: 0.3 10*3/uL (ref 0.0–0.5)
Eosinophils Relative: 3 %
HCT: 40.9 % (ref 36.0–46.0)
Hemoglobin: 13.6 g/dL (ref 12.0–15.0)
Immature Granulocytes: 0 %
Lymphocytes Relative: 29 %
Lymphs Abs: 2.6 10*3/uL (ref 0.7–4.0)
MCH: 31.7 pg (ref 26.0–34.0)
MCHC: 33.3 g/dL (ref 30.0–36.0)
MCV: 95.3 fL (ref 80.0–100.0)
Monocytes Absolute: 0.5 10*3/uL (ref 0.1–1.0)
Monocytes Relative: 6 %
Neutro Abs: 5.4 10*3/uL (ref 1.7–7.7)
Neutrophils Relative %: 61 %
Platelets: 319 10*3/uL (ref 150–400)
RBC: 4.29 MIL/uL (ref 3.87–5.11)
RDW: 13.9 % (ref 11.5–15.5)
WBC: 8.8 10*3/uL (ref 4.0–10.5)
nRBC: 0 % (ref 0.0–0.2)

## 2022-02-14 LAB — URINALYSIS, ROUTINE W REFLEX MICROSCOPIC
Bacteria, UA: NONE SEEN
Bilirubin Urine: NEGATIVE
Glucose, UA: NEGATIVE mg/dL
Ketones, ur: 5 mg/dL — AB
Leukocytes,Ua: NEGATIVE
Nitrite: NEGATIVE
Protein, ur: 30 mg/dL — AB
Specific Gravity, Urine: 1.027 (ref 1.005–1.030)
pH: 5 (ref 5.0–8.0)

## 2022-02-14 LAB — COMPREHENSIVE METABOLIC PANEL
ALT: 15 U/L (ref 0–44)
AST: 20 U/L (ref 15–41)
Albumin: 3.9 g/dL (ref 3.5–5.0)
Alkaline Phosphatase: 53 U/L (ref 38–126)
Anion gap: 7 (ref 5–15)
BUN: 19 mg/dL (ref 8–23)
CO2: 30 mmol/L (ref 22–32)
Calcium: 9.1 mg/dL (ref 8.9–10.3)
Chloride: 105 mmol/L (ref 98–111)
Creatinine, Ser: 0.86 mg/dL (ref 0.44–1.00)
GFR, Estimated: >60 mL/min (ref >60)
Glucose, Bld: 130 mg/dL — ABNORMAL HIGH (ref 70–99)
Potassium: 3.4 mmol/L — ABNORMAL LOW (ref 3.5–5.1)
Sodium: 142 mmol/L (ref 135–145)
Total Bilirubin: 0.7 mg/dL (ref 0.3–1.2)
Total Protein: 6.9 g/dL (ref 6.5–8.1)

## 2022-02-14 LAB — TYPE AND SCREEN
ABO/RH(D): O POS
Antibody Screen: NEGATIVE

## 2022-02-14 LAB — SURGICAL PCR SCREEN
MRSA, PCR: NEGATIVE
Staphylococcus aureus: NEGATIVE

## 2022-02-14 NOTE — Patient Instructions (Addendum)
Your procedure is scheduled on: Tuesday, January 30 Report to the Registration Desk on the 1st floor of the [facility] To find out your arrival time, please call [phone] between 1PM - 3PM on: Monday, January 29 If your arrival time is 6:00 am, do not arrive prior to that time as the [facility] entrance doors do not open until 6:00 am.  REMEMBER: Instructions that are not followed completely may result in serious medical risk, up to and including death; or upon the discretion of your surgeon and anesthesiologist your surgery may need to be rescheduled.  Do not eat food after midnight the night before surgery.  No gum chewing, lozengers or hard candies.  You may however, drink CLEAR liquids up to 2 hours before you are scheduled to arrive for your surgery. Do not drink anything within 2 hours of your scheduled arrival time.  Clear liquids include: - water  - apple juice without pulp - gatorade (not RED colors) - black coffee or tea (Do NOT add milk or creamers to the coffee or tea) Do NOT drink anything that is not on this list.  In addition, your doctor has ordered for you to drink the provided  Ensure Pre-Surgery Clear Carbohydrate Drink  Drinking this carbohydrate drink up to two hours before surgery helps to reduce insulin resistance and improve patient outcomes. Please complete drinking 2 hours prior to scheduled arrival time.  TAKE THESE MEDICATIONS THE MORNING OF SURGERY WITH A SIP OF WATER:  Atorvastatin (Lipitor) Symbicort inhaler  Use inhaler on the day of surgery and bring your albuterol inhaler to the hospital.  One week prior to surgery: starting January 23 Stop Anti-inflammatories (NSAIDS) such as Advil, Aleve, Ibuprofen, Motrin, Naproxen, Naprosyn and Aspirin based products such as Excedrin, Goodys Powder, BC Powder. Stop ANY OVER THE COUNTER supplements until after surgery. You may however, continue to take Tylenol if needed for pain up until the day of  surgery.  No Alcohol for 24 hours before or after surgery.  No Smoking including e-cigarettes for 24 hours prior to surgery.  No chewable tobacco products for at least 6 hours prior to surgery.  No nicotine patches on the day of surgery.  Do not use any "recreational" drugs for at least a week prior to your surgery.  Please be advised that the combination of cocaine and anesthesia may have negative outcomes, up to and including death. If you test positive for cocaine, your surgery will be cancelled.  On the morning of surgery brush your teeth with toothpaste and water, you may rinse your mouth with mouthwash if you wish. Do not swallow any toothpaste or mouthwash.  Use CHG Soap as directed on instruction sheet.  Do not wear jewelry, make-up, hairpins, clips or nail polish.  Do not wear lotions, powders, or perfumes.   Do not shave body from the neck down 48 hours prior to surgery just in case you cut yourself which could leave a site for infection.  Also, freshly shaved skin may become irritated if using the CHG soap.  Contact lenses, hearing aids and dentures may not be worn into surgery.  Do not bring valuables to the hospital. [facility] is not responsible for any missing/lost belongings or valuables.   Total Shoulder Arthroplasty:  use Benzolyl Peroxide 5% Gel as directed on instruction sheet.  Notify your doctor if there is any change in your medical condition (cold, fever, infection).  Wear comfortable clothing (specific to your surgery type) to the hospital.  After surgery, you can help prevent lung complications by doing breathing exercises.  Take deep breaths and cough every 1-2 hours. Your doctor may order a device called an Incentive Spirometer to help you take deep breaths.  If you are being admitted to the hospital overnight, leave your suitcase in the car. After surgery it may be brought to your room.  If you are being discharged the day of surgery, you will not  be allowed to drive home. You will need a responsible adult (18 years or older) to drive you home and stay with you that night.   If you are taking public transportation, you will need to have a responsible adult (18 years or older) with you. Please confirm with your physician that it is acceptable to use public transportation.   Please call the [place] [facility] at [phone] if you have any questions about these instructions.  Surgery Visitation Policy:  Patients undergoing a surgery or procedure may have two family members or support persons with them as long as the person is not COVID-19 positive or experiencing its symptoms.   Inpatient Visitation:    Visiting hours are 7 a.m. to 8 p.m. Up to four visitors are allowed at one time in a patient room. The visitors may rotate out with other people during the day. One designated support person (adult) may remain overnight.  Due to an increase in RSV and influenza rates and associated hospitalizations, children ages 48 and under will not be able to visit patients in [facility]. Masks continue to be strongly recommended.     Preparing for Surgery with CHLORHEXIDINE GLUCONATE (CHG) Soap  Chlorhexidine Gluconate (CHG) Soap  o An antiseptic cleaner that kills germs and bonds with the skin to continue killing germs even after washing  o Used for showering the night before surgery and morning of surgery  Before surgery, you can play an important role by reducing the number of germs on your skin.  CHG (Chlorhexidine gluconate) soap is an antiseptic cleanser which kills germs and bonds with the skin to continue killing germs even after washing.  Please do not use if you have an allergy to CHG or antibacterial soaps. If your skin becomes reddened/irritated stop using the CHG.  1. Shower the NIGHT BEFORE SURGERY and the MORNING OF SURGERY with CHG soap.  2. If you choose to wash your hair, wash your hair first as  usual with your normal shampoo.  3. After shampooing, rinse your hair and body thoroughly to remove the shampoo.  4. Use CHG as you would any other liquid soap. You can apply CHG directly to the skin and wash gently with a scrungie or a clean washcloth.  5. Apply the CHG soap to your body only from the neck down. Do not use on open wounds or open sores. Avoid contact with your eyes, ears, mouth, and genitals (private parts). Wash face and genitals (private parts) with your normal soap.  6. Wash thoroughly, paying special attention to the area where your surgery will be performed.  7. Thoroughly rinse your body with warm water.  8. Do not shower/wash with your normal soap after using and rinsing off the CHG soap.  9. Pat yourself dry with a clean towel.  10. Wear clean pajamas to bed the night before surgery.  12. Place clean sheets on your bed the night of your first shower and do not sleep with pets.  13. Shower again with the CHG soap on  the day of surgery prior to arriving at the hospital.  14. Do not apply any deodorants/lotions/powders.  15. Please wear clean clothes to the hospital.    Preparing for Total Shoulder Arthroplasty  Before surgery, you can play an important role by reducing the number of germs on your skin by using the following products:  Benzoyl Peroxide Gel  o Reduces the number of germs present on the skin  o Applied twice a day to shoulder area starting two days before surgery  Chlorhexidine Gluconate (CHG) Soap  o An antiseptic cleaner that kills germs and bonds with the skin to continue killing germs even after washing  o Used for showering the night before surgery and morning of surgery  BENZOYL PEROXIDE 5% GEL  Please do not use if you have an allergy to benzoyl peroxide. If your skin becomes reddened/irritated stop using the benzoyl peroxide.  Starting two days before surgery, apply as follows:  1. Apply benzoyl peroxide in the morning and  at night. Apply after taking a shower. If you are not taking a shower clean entire shoulder front, back, and side along with the armpit with a clean wet washcloth.  2. Place a quarter-sized dollop on your shoulder and rub in thoroughly, making sure to cover the front, back, and side of your shoulder, along with the armpit.  2 days before (Sunday, 02/24/22)  __xx__ AM __xx__ PM  AND (Monday, 02/25/22) _xx___ AM _xx_ PM  3. Do this twice a day for two days. (Last application is the night before surgery, AFTER using the CHG soap).  4. Do NOT apply benzoyl peroxide gel on the day of surgery. NOT ON TUESDAY 02/26/22

## 2022-02-26 ENCOUNTER — Other Ambulatory Visit: Payer: Self-pay

## 2022-02-26 ENCOUNTER — Ambulatory Visit: Payer: Medicare HMO

## 2022-02-26 ENCOUNTER — Ambulatory Visit: Payer: Medicare HMO | Admitting: Urgent Care

## 2022-02-26 ENCOUNTER — Ambulatory Visit: Payer: Medicare HMO | Admitting: Certified Registered"

## 2022-02-26 ENCOUNTER — Encounter: Payer: Self-pay | Admitting: Surgery

## 2022-02-26 ENCOUNTER — Encounter
Admission: RE | Disposition: A | Discharge status: Home / Self Care | Payer: Self-pay | Source: Ambulatory Visit | Attending: Surgery

## 2022-02-26 ENCOUNTER — Ambulatory Visit
Admission: RE | Admit: 2022-02-26 | Discharge: 2022-02-26 | Disposition: A | Discharge status: Home / Self Care | Payer: Medicare HMO | Source: Ambulatory Visit | Attending: Surgery | Admitting: Surgery

## 2022-02-26 DIAGNOSIS — E7800 Pure hypercholesterolemia, unspecified: Secondary | ICD-10-CM | POA: Diagnosis not present

## 2022-02-26 DIAGNOSIS — M87221 Osteonecrosis due to previous trauma, right humerus: Secondary | ICD-10-CM | POA: Diagnosis not present

## 2022-02-26 DIAGNOSIS — S42201S Unspecified fracture of upper end of right humerus, sequela: Secondary | ICD-10-CM | POA: Insufficient documentation

## 2022-02-26 DIAGNOSIS — M19111 Post-traumatic osteoarthritis, right shoulder: Secondary | ICD-10-CM | POA: Diagnosis not present

## 2022-02-26 DIAGNOSIS — W19XXXS Unspecified fall, sequela: Secondary | ICD-10-CM | POA: Diagnosis not present

## 2022-02-26 DIAGNOSIS — Z471 Aftercare following joint replacement surgery: Secondary | ICD-10-CM | POA: Diagnosis not present

## 2022-02-26 DIAGNOSIS — Z01812 Encounter for preprocedural laboratory examination: Secondary | ICD-10-CM

## 2022-02-26 DIAGNOSIS — M7521 Bicipital tendinitis, right shoulder: Secondary | ICD-10-CM | POA: Diagnosis not present

## 2022-02-26 DIAGNOSIS — S42202A Unspecified fracture of upper end of left humerus, initial encounter for closed fracture: Secondary | ICD-10-CM | POA: Diagnosis not present

## 2022-02-26 DIAGNOSIS — Z96611 Presence of right artificial shoulder joint: Secondary | ICD-10-CM | POA: Diagnosis not present

## 2022-02-26 DIAGNOSIS — Z87891 Personal history of nicotine dependence: Secondary | ICD-10-CM | POA: Insufficient documentation

## 2022-02-26 DIAGNOSIS — M810 Age-related osteoporosis without current pathological fracture: Secondary | ICD-10-CM | POA: Insufficient documentation

## 2022-02-26 DIAGNOSIS — N189 Chronic kidney disease, unspecified: Secondary | ICD-10-CM | POA: Insufficient documentation

## 2022-02-26 DIAGNOSIS — J449 Chronic obstructive pulmonary disease, unspecified: Secondary | ICD-10-CM | POA: Insufficient documentation

## 2022-02-26 DIAGNOSIS — S42201A Unspecified fracture of upper end of right humerus, initial encounter for closed fracture: Secondary | ICD-10-CM | POA: Diagnosis not present

## 2022-02-26 DIAGNOSIS — I700 Atherosclerosis of aorta: Secondary | ICD-10-CM | POA: Diagnosis not present

## 2022-02-26 DIAGNOSIS — M7581 Other shoulder lesions, right shoulder: Secondary | ICD-10-CM | POA: Diagnosis not present

## 2022-02-26 HISTORY — PX: REVERSE SHOULDER ARTHROPLASTY: SHX5054

## 2022-02-26 LAB — ABO/RH: ABO/RH(D): O POS

## 2022-02-26 SURGERY — ARTHROPLASTY, SHOULDER, TOTAL, REVERSE
Anesthesia: General | Site: Shoulder | Laterality: Right

## 2022-02-26 MED ORDER — FENTANYL CITRATE (PF) 100 MCG/2ML IJ SOLN: 25.0000 ug | INTRAMUSCULAR | Status: DC | PRN

## 2022-02-26 MED ORDER — DEXMEDETOMIDINE HCL IN NACL 80 MCG/20ML IV SOLN
INTRAVENOUS | Status: DC | PRN
  Administered 2022-02-26 (×2): 4 ug via BUCCAL

## 2022-02-26 MED ORDER — SODIUM CHLORIDE 0.9 % IR SOLN
Status: DC | PRN
  Administered 2022-02-26: 3000 mL

## 2022-02-26 MED ORDER — ACETAMINOPHEN 10 MG/ML IV SOLN
INTRAVENOUS | Status: AC
  Filled 2022-02-26: qty 100

## 2022-02-26 MED ORDER — BUPIVACAINE-EPINEPHRINE (PF) 0.5% -1:200000 IJ SOLN
INTRAMUSCULAR | Status: AC
  Filled 2022-02-26: qty 30

## 2022-02-26 MED ORDER — FAMOTIDINE 20 MG PO TABS
ORAL_TABLET | ORAL | Status: AC
  Administered 2022-02-26: 20 mg via ORAL
  Filled 2022-02-26: qty 1

## 2022-02-26 MED ORDER — 0.9 % SODIUM CHLORIDE (POUR BTL) OPTIME
TOPICAL | Status: DC | PRN
  Administered 2022-02-26: 250 mL

## 2022-02-26 MED ORDER — OXYCODONE HCL 5 MG PO TABS: 5.0000 mg | ORAL_TABLET | Freq: Once | ORAL | Status: DC | PRN

## 2022-02-26 MED ORDER — FENTANYL CITRATE (PF) 100 MCG/2ML IJ SOLN
INTRAMUSCULAR | Status: AC
  Filled 2022-02-26: qty 2

## 2022-02-26 MED ORDER — OXYCODONE HCL 5 MG PO TABS: 5.0000 mg | ORAL_TABLET | ORAL | 0 refills | Status: DC | PRN

## 2022-02-26 MED ORDER — MIDAZOLAM HCL 2 MG/2ML IJ SOLN
INTRAMUSCULAR | Status: DC | PRN
  Administered 2022-02-26: 2 mg via INTRAVENOUS

## 2022-02-26 MED ORDER — ACETAMINOPHEN 10 MG/ML IV SOLN
INTRAVENOUS | Status: DC | PRN
  Administered 2022-02-26: 800 mg via INTRAVENOUS

## 2022-02-26 MED ORDER — TRANEXAMIC ACID 1000 MG/10ML IV SOLN
INTRAVENOUS | Status: DC | PRN
  Administered 2022-02-26: 1000 mg via INTRAVENOUS

## 2022-02-26 MED ORDER — CHLORHEXIDINE GLUCONATE 0.12 % MT SOLN: 15.0000 mL | Freq: Once | OROMUCOSAL | Status: AC

## 2022-02-26 MED ORDER — TRIAMCINOLONE ACETONIDE 40 MG/ML IJ SUSP
INTRAMUSCULAR | Status: AC
  Filled 2022-02-26: qty 2

## 2022-02-26 MED ORDER — IPRATROPIUM-ALBUTEROL 0.5-2.5 (3) MG/3ML IN SOLN
RESPIRATORY_TRACT | Status: AC
  Filled 2022-02-26: qty 3

## 2022-02-26 MED ORDER — FAMOTIDINE 20 MG PO TABS: 20.0000 mg | ORAL_TABLET | Freq: Once | ORAL | Status: AC

## 2022-02-26 MED ORDER — OXYCODONE HCL 5 MG PO TABS: 5.0000 mg | ORAL_TABLET | ORAL | Status: DC | PRN

## 2022-02-26 MED ORDER — LIDOCAINE HCL (CARDIAC) PF 100 MG/5ML IV SOSY
PREFILLED_SYRINGE | INTRAVENOUS | Status: DC | PRN
  Administered 2022-02-26: 50 mg via INTRAVENOUS

## 2022-02-26 MED ORDER — PROPOFOL 10 MG/ML IV BOLUS
INTRAVENOUS | Status: AC
  Filled 2022-02-26: qty 20

## 2022-02-26 MED ORDER — KETAMINE HCL 10 MG/ML IJ SOLN
INTRAMUSCULAR | Status: DC | PRN
  Administered 2022-02-26: 25 mg via INTRAVENOUS
  Administered 2022-02-26: 5 mg via INTRAVENOUS

## 2022-02-26 MED ORDER — ONDANSETRON HCL 4 MG/2ML IJ SOLN: 4.0000 mg | Freq: Four times a day (QID) | INTRAMUSCULAR | Status: DC | PRN

## 2022-02-26 MED ORDER — CEFAZOLIN SODIUM-DEXTROSE 2-4 GM/100ML-% IV SOLN: 2.0000 g | Freq: Four times a day (QID) | INTRAVENOUS | Status: DC

## 2022-02-26 MED ORDER — ONDANSETRON HCL 4 MG PO TABS: 4.0000 mg | ORAL_TABLET | Freq: Four times a day (QID) | ORAL | Status: DC | PRN

## 2022-02-26 MED ORDER — CEFAZOLIN SODIUM-DEXTROSE 2-4 GM/100ML-% IV SOLN
INTRAVENOUS | Status: AC
  Filled 2022-02-26: qty 100

## 2022-02-26 MED ORDER — SODIUM CHLORIDE 0.9 % IV SOLN: INTRAVENOUS | Status: DC

## 2022-02-26 MED ORDER — SUGAMMADEX SODIUM 200 MG/2ML IV SOLN
INTRAVENOUS | Status: DC | PRN
  Administered 2022-02-26: 100 mg via INTRAVENOUS

## 2022-02-26 MED ORDER — KETOROLAC TROMETHAMINE 15 MG/ML IJ SOLN
15.0000 mg | Freq: Once | INTRAMUSCULAR | Status: AC
  Administered 2022-02-26: 15 mg via INTRAVENOUS

## 2022-02-26 MED ORDER — GLYCOPYRROLATE 0.2 MG/ML IJ SOLN
INTRAMUSCULAR | Status: DC | PRN
  Administered 2022-02-26: .2 mg via INTRAVENOUS

## 2022-02-26 MED ORDER — CHLORHEXIDINE GLUCONATE 0.12 % MT SOLN
OROMUCOSAL | Status: AC
  Administered 2022-02-26: 15 mL via OROMUCOSAL
  Filled 2022-02-26: qty 15

## 2022-02-26 MED ORDER — METOCLOPRAMIDE HCL 5 MG/ML IJ SOLN: 5.0000 mg | Freq: Three times a day (TID) | INTRAMUSCULAR | Status: DC | PRN

## 2022-02-26 MED ORDER — MIDAZOLAM HCL 2 MG/2ML IJ SOLN
INTRAMUSCULAR | Status: AC
  Filled 2022-02-26: qty 2

## 2022-02-26 MED ORDER — KETAMINE HCL 50 MG/5ML IJ SOSY
PREFILLED_SYRINGE | INTRAMUSCULAR | Status: AC
  Filled 2022-02-26: qty 5

## 2022-02-26 MED ORDER — FENTANYL CITRATE (PF) 100 MCG/2ML IJ SOLN
INTRAMUSCULAR | Status: DC | PRN
  Administered 2022-02-26 (×2): 50 ug via INTRAVENOUS

## 2022-02-26 MED ORDER — METOCLOPRAMIDE HCL 10 MG PO TABS: 5.0000 mg | ORAL_TABLET | Freq: Three times a day (TID) | ORAL | Status: DC | PRN

## 2022-02-26 MED ORDER — KETOROLAC TROMETHAMINE 15 MG/ML IJ SOLN
INTRAMUSCULAR | Status: AC
  Filled 2022-02-26: qty 1

## 2022-02-26 MED ORDER — TRANEXAMIC ACID 1000 MG/10ML IV SOLN
INTRAVENOUS | Status: AC
  Filled 2022-02-26: qty 10

## 2022-02-26 MED ORDER — TRIAMCINOLONE ACETONIDE 40 MG/ML IJ SUSP
INTRAMUSCULAR | Status: DC | PRN
  Administered 2022-02-26: 52.5 mL

## 2022-02-26 MED ORDER — DEXAMETHASONE SODIUM PHOSPHATE 10 MG/ML IJ SOLN
INTRAMUSCULAR | Status: DC | PRN
  Administered 2022-02-26: 5 mg via INTRAVENOUS

## 2022-02-26 MED ORDER — ALENDRONATE SODIUM 70 MG PO TABS: 70.0000 mg | ORAL_TABLET | ORAL | Status: DC

## 2022-02-26 MED ORDER — ALBUTEROL SULFATE HFA 108 (90 BASE) MCG/ACT IN AERS
INHALATION_SPRAY | RESPIRATORY_TRACT | Status: DC | PRN
  Administered 2022-02-26 (×2): 6 via RESPIRATORY_TRACT

## 2022-02-26 MED ORDER — SODIUM CHLORIDE (PF) 0.9 % IJ SOLN
INTRAMUSCULAR | Status: AC
  Filled 2022-02-26: qty 20

## 2022-02-26 MED ORDER — LACTATED RINGERS IV SOLN: INTRAVENOUS | Status: DC

## 2022-02-26 MED ORDER — BUPIVACAINE LIPOSOME 1.3 % IJ SUSP
INTRAMUSCULAR | Status: AC
  Filled 2022-02-26: qty 20

## 2022-02-26 MED ORDER — ROCURONIUM BROMIDE 100 MG/10ML IV SOLN
INTRAVENOUS | Status: DC | PRN
  Administered 2022-02-26: 50 mg via INTRAVENOUS

## 2022-02-26 MED ORDER — SODIUM CHLORIDE FLUSH 0.9 % IV SOLN
INTRAVENOUS | Status: AC
  Filled 2022-02-26: qty 20

## 2022-02-26 MED ORDER — PROPOFOL 10 MG/ML IV BOLUS
INTRAVENOUS | Status: DC | PRN
  Administered 2022-02-26: 100 mg via INTRAVENOUS

## 2022-02-26 MED ORDER — CEFAZOLIN SODIUM-DEXTROSE 2-4 GM/100ML-% IV SOLN
INTRAVENOUS | Status: AC
  Administered 2022-02-26: 2 g via INTRAVENOUS
  Filled 2022-02-26: qty 100

## 2022-02-26 MED ORDER — ONDANSETRON HCL 4 MG/2ML IJ SOLN
INTRAMUSCULAR | Status: DC | PRN
  Administered 2022-02-26: 4 mg via INTRAVENOUS

## 2022-02-26 MED ORDER — IPRATROPIUM-ALBUTEROL 0.5-2.5 (3) MG/3ML IN SOLN
3.0000 mL | RESPIRATORY_TRACT | Status: AC
  Administered 2022-02-26: 3 mL via RESPIRATORY_TRACT

## 2022-02-26 MED ORDER — PHENYLEPHRINE HCL (PRESSORS) 10 MG/ML IV SOLN
INTRAVENOUS | Status: DC | PRN
  Administered 2022-02-26 (×10): 80 ug via INTRAVENOUS

## 2022-02-26 MED ORDER — OXYCODONE HCL 5 MG/5ML PO SOLN: 5.0000 mg | Freq: Once | ORAL | Status: DC | PRN

## 2022-02-26 MED ORDER — PHENYLEPHRINE HCL-NACL 20-0.9 MG/250ML-% IV SOLN
INTRAVENOUS | Status: DC | PRN
  Administered 2022-02-26: 20 ug/min via INTRAVENOUS

## 2022-02-26 MED ORDER — CEFAZOLIN SODIUM-DEXTROSE 2-4 GM/100ML-% IV SOLN
2.0000 g | INTRAVENOUS | Status: AC
  Administered 2022-02-26: 2 g via INTRAVENOUS

## 2022-02-26 MED ORDER — ORAL CARE MOUTH RINSE: 15.0000 mL | Freq: Once | OROMUCOSAL | Status: AC

## 2022-02-26 SURGICAL SUPPLY — 72 items
APL PRP STRL LF DISP 70% ISPRP (MISCELLANEOUS) ×1
BASEPLATE BOSS DRILL (MISCELLANEOUS) IMPLANT
BIT DRILL 2.5 (BIT) ×1
BIT DRILL 2.5X4.5XSCR (BIT) IMPLANT
BIT DRILL BASEPLATE CENTRAL S (BIT) IMPLANT
BIT DRL 2.5X4.5XSCR (BIT) ×1
BLADE SAW SAG 25X90X1.19 (BLADE) ×1 IMPLANT
BSPLAT GLND THK2 22.8X27.3 (Plate) ×1 IMPLANT
CHLORAPREP W/TINT 26 (MISCELLANEOUS) ×1 IMPLANT
COOLER POLAR GLACIER W/PUMP (MISCELLANEOUS) ×1 IMPLANT
COVER BACK TABLE REUSABLE LG (DRAPES) ×1 IMPLANT
DRAPE 3/4 80X56 (DRAPES) ×1 IMPLANT
DRAPE INCISE IOBAN 66X45 STRL (DRAPES) ×1 IMPLANT
DRSG OPSITE POSTOP 4X8 (GAUZE/BANDAGES/DRESSINGS) ×1 IMPLANT
ELECT BLADE 6.5 EXT (BLADE) IMPLANT
ELECT CAUTERY BLADE 6.4 (BLADE) ×1 IMPLANT
ELECT REM PT RETURN 9FT ADLT (ELECTROSURGICAL) ×1
ELECTRODE REM PT RTRN 9FT ADLT (ELECTROSURGICAL) ×1 IMPLANT
GAUZE XEROFORM 1X8 LF (GAUZE/BANDAGES/DRESSINGS) ×1 IMPLANT
GLENOSPHERE RSS 2 CONCENTRIC (Shoulder) IMPLANT
GLOVE BIO SURGEON STRL SZ7.5 (GLOVE) ×4 IMPLANT
GLOVE BIO SURGEON STRL SZ8 (GLOVE) ×4 IMPLANT
GLOVE BIOGEL PI IND STRL 8 (GLOVE) ×2 IMPLANT
GLOVE SURG UNDER LTX SZ8 (GLOVE) ×1 IMPLANT
GOWN STRL REUS W/ TWL LRG LVL3 (GOWN DISPOSABLE) ×1 IMPLANT
GOWN STRL REUS W/ TWL XL LVL3 (GOWN DISPOSABLE) ×1 IMPLANT
GOWN STRL REUS W/TWL LRG LVL3 (GOWN DISPOSABLE) ×1
GOWN STRL REUS W/TWL XL LVL3 (GOWN DISPOSABLE) ×1
GUIDE PIN 2.0 X 150 (WIRE) IMPLANT
HANDLE YANKAUER SUCT OPEN TIP (MISCELLANEOUS) IMPLANT
HOOD PEEL AWAY T7 (MISCELLANEOUS) ×3 IMPLANT
IV NS IRRIG 3000ML ARTHROMATIC (IV SOLUTION) ×1 IMPLANT
KIT STABILIZATION SHOULDER (MISCELLANEOUS) ×1 IMPLANT
KIT TURNOVER KIT A (KITS) ×1 IMPLANT
LINER STD +0S RSS HXL (Liner) IMPLANT
MANIFOLD NEPTUNE II (INSTRUMENTS) ×1 IMPLANT
MASK FACE SPIDER DISP (MASK) ×1 IMPLANT
MAT ABSORB  FLUID 56X50 GRAY (MISCELLANEOUS) ×1
MAT ABSORB FLUID 56X50 GRAY (MISCELLANEOUS) ×1 IMPLANT
NDL MAYO CATGUT SZ1 (NEEDLE) IMPLANT
NDL SAFETY ECLIP 18X1.5 (MISCELLANEOUS) ×1 IMPLANT
NDL SPNL 20GX3.5 QUINCKE YW (NEEDLE) ×1 IMPLANT
NEEDLE MAYO CATGUT SZ1 (NEEDLE) IMPLANT
NEEDLE SPNL 20GX3.5 QUINCKE YW (NEEDLE) ×1 IMPLANT
NS IRRIG 500ML POUR BTL (IV SOLUTION) ×1 IMPLANT
PACK ARTHROSCOPY SHOULDER (MISCELLANEOUS) ×1 IMPLANT
PAD ARMBOARD 7.5X6 YLW CONV (MISCELLANEOUS) ×1 IMPLANT
PAD WRAPON POLAR SHDR UNIV (MISCELLANEOUS) ×1 IMPLANT
PLATE BASE REVERSE RSS S (Plate) IMPLANT
PULSAVAC PLUS IRRIG FAN TIP (DISPOSABLE) ×1
SCREW 4.5X15 RSS W CAP (Screw) IMPLANT
SCREW 4.5X20 RSS W CAP (Screw) IMPLANT
SCREW 4.5X25 RSS W CAP (Screw) IMPLANT
SCREW 4.5X30 RSS W CAP (Screw) IMPLANT
SCREW BODY REVERSE SMALL TITAN (Screw) IMPLANT
SLING ULTRA II M (MISCELLANEOUS) IMPLANT
SPONGE T-LAP 18X18 ~~LOC~~+RFID (SPONGE) ×2 IMPLANT
STAPLER SKIN PROX 35W (STAPLE) ×1 IMPLANT
STEM PRES FIT SZ 12 TSS (Stem) IMPLANT
SUT ETHIBOND 0 MO6 C/R (SUTURE) ×1 IMPLANT
SUT FIBERWIRE #2 38 BLUE 1/2 (SUTURE) ×4
SUT VIC AB 0 CT1 36 (SUTURE) ×1 IMPLANT
SUT VIC AB 2-0 CT1 27 (SUTURE) ×2
SUT VIC AB 2-0 CT1 TAPERPNT 27 (SUTURE) ×2 IMPLANT
SUTURE FIBERWR #2 38 BLUE 1/2 (SUTURE) ×4 IMPLANT
SYR 10ML LL (SYRINGE) ×1 IMPLANT
SYR 30ML LL (SYRINGE) ×1 IMPLANT
SYR TOOMEY 50ML (SYRINGE) ×1 IMPLANT
TIP FAN IRRIG PULSAVAC PLUS (DISPOSABLE) ×1 IMPLANT
TRAP FLUID SMOKE EVACUATOR (MISCELLANEOUS) ×1 IMPLANT
WATER STERILE IRR 1000ML POUR (IV SOLUTION) IMPLANT
WRAPON POLAR PAD SHDR UNIV (MISCELLANEOUS) ×1

## 2022-02-26 NOTE — Anesthesia Preprocedure Evaluation (Addendum)
Anesthesia Evaluation  Patient identified by MRN, date of birth, ID band Patient awake    Reviewed: Allergy & Precautions, NPO status , Patient's Chart, lab work & pertinent test results  History of Anesthesia Complications Negative for: history of anesthetic complications  Airway Mallampati: III  TM Distance: >3 FB Neck ROM: full    Dental  (+) Dental Advidsory Given, Edentulous Lower, Edentulous Upper   Pulmonary neg shortness of breath, COPD,  COPD inhaler, former smoker   Pulmonary exam normal        Cardiovascular (-) Past MI and (-) CABG negative cardio ROS Normal cardiovascular exam     Neuro/Psych negative neurological ROS  negative psych ROS   GI/Hepatic negative GI ROS, Neg liver ROS,,,  Endo/Other  negative endocrine ROS    Renal/GU CRFRenal disease     Musculoskeletal   Abdominal   Peds  Hematology negative hematology ROS (+)   Anesthesia Other Findings Discussed with the patient a peripheral nerve block for her shoulder surgery. Discussed risks of the nerve block including infection, bleeding, nerve damage, phrenic nerve block, horner's syndrome and the chance of the nerve block not working. Patient declined the nerve block and stated she had a high pain tolerance.   Past Medical History: No date: Aortic atherosclerosis ([place]) 03/24/2019: Closed fracture of right proximal humerus No date: COPD (chronic obstructive pulmonary disease) (HCC) No date: Family history of adverse reaction to anesthesia     Comment:  sister has N/V No date: Hypercholesterolemia No date: Osteoporosis No date: Pneumonia No date: Pre-diabetes  Past Surgical History: 1976: ABDOMINAL HYSTERECTOMY 2005: bladder tack No date: COLONOSCOPY 2011: ORIF FOREARM FRACTURE; Right     Comment:  wrist  BMI    Body Mass Index: 22.29 kg/m      Reproductive/Obstetrics negative OB ROS                              Anesthesia Physical Anesthesia Plan  ASA: 3  Anesthesia Plan: General ETT   Post-op Pain Management: Dilaudid IV   Induction: Intravenous  PONV Risk Score and Plan: 3 and Dexamethasone, Ondansetron and Midazolam  Airway Management Planned: Oral ETT  Additional Equipment:   Intra-op Plan:   Post-operative Plan: Extubation in OR  Informed Consent: I have reviewed the patients History and Physical, chart, labs and discussed the procedure including the risks, benefits and alternatives for the proposed anesthesia with the patient or authorized representative who has indicated his/her understanding and acceptance.     Dental Advisory Given  Plan Discussed with: Anesthesiologist, CRNA and Surgeon  Anesthesia Plan Comments: (Patient consented for risks of anesthesia including but not limited to:  - adverse reactions to medications - damage to eyes, teeth, lips or other oral mucosa - nerve damage due to positioning  - sore throat or hoarseness - Damage to heart, brain, nerves, lungs, other parts of body or loss of life  Patient voiced understanding.)        Anesthesia Quick Evaluation

## 2022-02-26 NOTE — H&P (Signed)
History of Present Illness:  Lori Lambert is a 77 y.o. female who presents today for history and physical for an upcoming right shoulder reverse total shoulder arthroplasty to be done by Dr. Roland Rack on February 26, 2022.  The patient relates her symptoms as stemming from an injury 5 to 6 years ago when she fell and fractured her right proximal humerus. This injury was treated nonsurgically. The patient did reasonably well subsequently, but still had some difficulty with regaining all of her motion. She was doing reasonably well until about 7 months ago when, while reaching up into her cabinet, she felt a sudden pop in the right shoulder. She is went to [facility] urgent care clinic and was diagnosed with a ruptured long head of the biceps tendon. The patient followed up with Vance Peper, PA, who ordered x-rays and identified significant flattening of the humeral head with loss of the glenohumeral joint space. The patient received both an intra-articular injection as well as a subacromial injection on this visit which she states provided temporary relief of her symptoms. However, her symptoms recurred and so she has been referred to me for further evaluation and treatment. The patient describes the symptoms as marked (major pain with significant limitations) and have the quality of being aching, constant, miserable, nagging, stabbing, tender, and throbbing. The pain is localized to the lateral arm/shoulder and localized to the anterior shoulder. These symptoms are aggravated constantly, with normal daily activities, with sleeping, at higher levels of activity, with overhead activity, and reaching behind the back. She has tried acetaminophen with no significant benefit. She has tried rest without any relief. She has tried the two injections described above with limited benefit. The patient denies any neck pain, nor does she note any numbness or paresthesias down her arm to her hand. The patient is not a diabetic. The  patient has COPD. This complaint is not work related. She is a sports non-participant.  Shoulder Surgical History:  The patient has had no shoulder surgery in the past.  PMH/PSH/Family History/Social History/Meds/Allergies:  I have reviewed past medical, surgical, social and family history, medications and allergies as documented in the EMR.  Current Outpatient Medications: acetaminophen (TYLENOL) 325 MG tablet Take 650 mg by mouth every 6 (six) hours as needed  albuterol 90 mcg/actuation inhaler Inhale 2 puffs into the lungs every 4 (four) hours as needed.  alendronate (FOSAMAX) 70 MG tablet Take 1 tablet (70 mg total) by mouth every 7 (seven) days. Take with a full glass of water on an empty stomach.  atorvastatin (LIPITOR) 10 MG tablet Take 1 tablet by mouth once daily  cholecalciferol, vitamin D3, (VITAMIN D3) 125 mcg (5,000 unit) tablet Take by mouth.  SYMBICORT 160-4.5 mcg/actuation inhaler Inhale into the lungs once as needed  baclofen (LIORESAL) 10 MG tablet TAKE 1 TABLET(10 MG) BY MOUTH AT BEDTIME AS NEEDED FOR MUSCLE SPASMS (Patient not taking: Reported on 02/14/2022)   Allergies:  Sulfa (Sulfonamide Antibiotics) Rash   Past Medical History:  Diagnosis Date  COPD (chronic obstructive pulmonary disease) (CMS-HCC)  Osteoporosis   Past Surgical History:  BLADDER TACK 2005  ORIF FOREARM FRACTURE Right 2011  ABDOMINAL HYSTERECTOMY   Family History:  Myocardial Infarction (Heart attack) Mother  Myocardial Infarction (Heart attack) Father  Kidney cancer Sister   Social History:   Socioeconomic History:  Marital status: Single  Tobacco Use  Smoking status: Former  Types: Cigarettes  Quit date: 07/13/2014  Years since quitting: 7.5  Smokeless tobacco: Never  Vaping Use  Vaping Use: Never used   Review of Systems:  A comprehensive 14 point ROS was performed, reviewed, and the pertinent orthopaedic findings are documented in the HPI.  Physical Exam:  Vitals (02/14/22  1051):  BP: (!) 140/82  Weight: 54.4 kg (120 lb)  Height: 157.5 cm (5\' 2" )  PainSc: 3  PainLoc: Shoulder   General/Constitutional: The patient appears to be well-nourished, well-developed, and in no acute distress. Neuro/Psych: Normal mood and affect, oriented to person, place and time. Eyes: Non-icteric. Pupils are equal, round, and reactive to light, and exhibit synchronous movement. ENT: Unremarkable. Lymphatic: No palpable adenopathy. Respiratory: Lungs clear to auscultation, Normal chest excursion, No wheezes, and Non-labored breathing Cardiovascular: Regular rate and rhythm. No murmurs. and No edema, swelling or tenderness, except as noted in detailed exam. Integumentary: No impressive skin lesions present, except as noted in detailed exam. Musculoskeletal: Unremarkable, except as noted in detailed exam.  Heart: Examination of the heart reveals regular, rate, and rhythm. There is no murmur noted on ascultation. There is a normal apical pulse.  Lungs: Lungs are clear to auscultation. There is very minimal wheeze, without rhonchi, or crackles. There is normal expansion of bilateral chest walls.   Right shoulder exam: SKIN: Popeye deformity, otherwise normal SWELLING: none WARMTH: none LYMPH NODES: no adenopathy palpable CREPITUS: Mild glenohumeral crepitance TENDERNESS: Minimal tenderness over anterolateral shoulder ROM (active):  Forward flexion: 30 degrees Abduction: 20 degrees Internal rotation: Posterior aspect of right iliac crest ROM (passive):  Forward flexion: 45 degrees Abduction: 30 degrees  ER/IR at 90 abd: Not evaluated  She notes moderate to severe pain with all motions.  STRENGTH: Forward flexion: 4/5 Abduction: 4/5 External rotation: 4/5 Internal rotation: 4-4+/5 Pain with RC testing: Moderate pain with resisted strength testing in all planes  STABILITY: Normal  SPECIAL TESTS: Luan Pulling' test: Not evaluated Speed's test: Not evaluated Capsulitis -  pain w/ passive ER: no Crossed arm test: Not evaluated Crank: Not evaluated Anterior apprehension: Not evaluated Posterior apprehension: Not evaluated  She is neurovascularly intact to the right upper extremity.  Imaging:  Recent true AP and Y-scapular views of the right shoulder are available for review and have been reviewed by myself. These films demonstrate significant degenerative changes with complete loss of the glenohumeral joint space. There is a mall united well-healed fracture of the proximal humerus causing the humeral head to heal and some valgus alignment. 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 The subacromial space is mildly decreased. There is no subacromial or infra-clavicular spurring. She demonstrates a Type II acromion.  Assessment:  1. Closed fracture of proximal end of left humerus. 2. Post-traumatic osteoarthritis of right shoulder.  3. Rotator cuff arthropathy of right shoulder.   Plan:  The treatment options were discussed with the patient and her daughter. In addition, patient educational materials were provided regarding the diagnosis and treatment options. The patient is quite frustrated by her symptoms and functional limitations, and is ready to consider more aggressive treatment options. Therefore, I have recommended a surgical procedure, specifically a reverse right total shoulder arthroplasty. The procedure was discussed with the patient, as were the potential risks (including bleeding, infection, nerve and/or blood vessel injury, persistent or recurrent pain, loosening and/or failure of the components, dislocation, need for further surgery, blood clots, strokes, heart attacks and/or arhythmias, pneumonia, etc.) and benefits. The patient states his/her understanding and wishes to proceed. All of the patient's questions and concerns  were answered. She can call any  time with further concerns. She will follow up post-surgery, routine. This office visit took 45 minutes, of which >50% involved patient counseling/education.    H&P reviewed and patient re-examined. No changes.

## 2022-02-26 NOTE — Anesthesia Procedure Notes (Signed)
Procedure Name: Intubation Date/Time: 02/26/2022 7:43 AM  Performed by: Biagio Borg, CRNAPre-anesthesia Checklist: Patient identified, Emergency Drugs available, Suction available and Patient being monitored Patient Re-evaluated:Patient Re-evaluated prior to induction Oxygen Delivery Method: Circle system utilized Preoxygenation: Pre-oxygenation with 100% oxygen Induction Type: IV induction Ventilation: Mask ventilation without difficulty Laryngoscope Size: McGraph and 3 Grade View: Grade I Tube type: Oral Number of attempts: 1 Airway Equipment and Method: Stylet Placement Confirmation: ETT inserted through vocal cords under direct vision, positive ETCO2 and breath sounds checked- equal and bilateral Secured at: 20 cm Tube secured with: Tape Dental Injury: Teeth and Oropharynx as per pre-operative assessment

## 2022-02-26 NOTE — Anesthesia Postprocedure Evaluation (Signed)
Anesthesia Post Note  Patient: Lori Lambert  Procedure(s) Performed: REVERSE SHOULDER ARTHROPLASTY (Right: Shoulder)  Patient location during evaluation: PACU Anesthesia Type: General and Regional Level of consciousness: awake and alert Pain management: pain level controlled Vital Signs Assessment: post-procedure vital signs reviewed and stable Respiratory status: spontaneous breathing, nonlabored ventilation, respiratory function stable and patient connected to nasal cannula oxygen Cardiovascular status: blood pressure returned to baseline and stable Postop Assessment: no apparent nausea or vomiting Anesthetic complications: no   No notable events documented.   Last Vitals:  Vitals:   02/26/22 1018 02/26/22 1030  BP: (!) 110/43 116/63  Pulse: 86 89  Resp: 20 (!) 22  Temp: 36.8 C   SpO2: 95% (!) 87%    Last Pain:  Vitals:   02/26/22 1018  TempSrc:   PainSc: 0-No pain                 Ilene Qua

## 2022-02-26 NOTE — Discharge Instructions (Addendum)
Orthopedic discharge instructions: Keep dressing dry and intact.   This dressing will be changed at your first postoperative visit. May sponge bathe rest of body. Apply ice frequently to shoulder or use a Polar Care device. Take ibuprofen 600 mg TID OR Aleve 2 tablets BID with meals for 3-5 days, then as necessary. Take oxycodone as prescribed when needed.  May supplement with ES Tylenol if necessary. Keep shoulder immobilizer on at all times except may remove for bathing purposes and exercises. Follow-up in 10-14 days or as scheduled.    AMBULATORY SURGERY  DISCHARGE INSTRUCTIONS   The drugs that you were given will stay in your system until tomorrow so for the next 24 hours you should not:  Drive an automobile Make any legal decisions Drink any alcoholic beverage   You may resume regular meals tomorrow.  Today it is better to start with liquids and gradually work up to solid foods.  You may eat anything you prefer, but it is better to start with liquids, then soup and crackers, and gradually work up to solid foods.   Please notify your doctor immediately if you have any unusual bleeding, trouble breathing, redness and pain at the surgery site, drainage, fever, or pain not relieved by medication.      Additional Instructions:  PLEASE LEAVE GREEN ARMBAND ON FOR 4 DAYS  POLAR CARE INFORMATION  [email or web address]  How to use [place] Cold Therapy System?  YouTube   [email or web address]  OPERATING INSTRUCTIONS  Start the product With dry hands, connect the transformer to the electrical connection located on the top of the cooler. Next, plug the transformer into an appropriate electrical outlet. The unit will automatically start running at this point.  To stop the pump, disconnect electrical power.  Unplug to stop the product when not in use. Unplugging the Polar Care unit turns it off. Always unplug immediately after use. Never leave it  plugged in while unattended. Remove pad.    FIRST ADD WATER TO FILL LINE, THEN ICE---Replace ice when existing ice is almost melted  1 Discuss Treatment with your [place] Practitioner and Use Only as Prescribed 2 Apply Insulation Barrier & Cold Therapy Pad 3 Check for Moisture 4 Inspect Skin Regularly  Tips and Trouble Shooting Usage Tips 1. Use cubed or chunked ice for optimal performance. 2. It is recommended to drain the Pad between uses. To drain the pad, hold the Pad upright with the hose pointed toward the ground. Depress the black plunger and allow water to drain out. 3. You may disconnect the Pad from the unit without removing the pad from the affected area by depressing the silver tabs on the hose coupling and gently pulling the hoses apart. The Pad and unit will seal itself and will not leak. Note: Some dripping during release is normal. 4. DO NOT RUN PUMP WITHOUT WATER! The pump in this unit is designed to run with water. Running the unit without water will cause permanent damage to the pump. 5. Unplug unit before removing lid.  TROUBLESHOOTING GUIDE Pump not running, Water not flowing to the pad, Pad is not getting cold 1. Make sure the transformer is plugged into the wall outlet. 2. Confirm that the ice and water are filled to the indicated levels. 3. Make sure there are no kinks in the pad. 4. Gently pull on the blue tube to make sure the tube/pad junction is straight. 5. Remove the pad from the treatment site and ll it while  the pad is lying at; then reapply. 6. Confirm that the pad couplings are securely attached to the unit. Listen for the double clicks (Figure 1) to confirm the pad couplings are securely attached.  Leaks    Note: Some condensation on the lines, controller, and pads is unavoidable, especially in warmer climates. 1. If using a [place] Polar Care Cold Therapy unit with a detachable Cold Therapy Pad, and a leak exists (other than condensation on the  lines) disconnect the pad couplings. Make sure the silver tabs on the couplings are depressed before reconnecting the pad to the pump hose; then confirm both sides of the coupling are properly clicked in. 2. If the coupling continues to leak or a leak is detected in the pad itself, stop using it and call [place] at [phone].  Cleaning After use, empty and dry the unit with a soft cloth. Warm water and mild detergent may be used occasionally to clean the pump and tubes.  WARNING: The [place] can be cold enough to cause serious injury, including full skin necrosis. Follow these Operating Instructions, and carefully read the Product Insert (see pouch on side of unit) and the Cold Therapy Pad Fitting Instructions (provided with each Cold Therapy Pad) prior to use.  SHOULDER SLING IMMOBILIZER   VIDEO Slingshot 2 Shoulder Brace Application - YouTube ---[email or web address]  INSTRUCTIONS While supporting the injured arm, slide the forearm into the sling. Wrap the adjustable shoulder strap around the neck and shoulders and attach the strap end to the sling using  the "alligator strap tab."  Adjust the shoulder strap to the required length. Position the shoulder pad behind the neck. To secure the shoulder pad location (optional), pull the shoulder strap away from the shoulder pad, unfold the hook material on the top of the pad, then press the shoulder strap back onto the hook material to secure the pad in place. Attach the closure strap across the open top of the sling. Position the strap so that it holds the arm securely in the sling. Next, attach the thumb strap to the open end of the sling between the thumb and fingers. After sling has been fit, it may be easily removed and reapplied using the quick release buckle on shoulder strap. If a neutral pillow or 15 abduction pillow is included, place the pillow at the waistline. Attach the sling to the pillow,  lining up hook material on the pillow with the loop on sling. Adjust the waist strap to fit.  If waist strap is too long, cut it to fit. Use the small piece of double sided hook material (located on top of the pillow) to secure the strap end. Place the double sided hook material on the inside of the cut strap end and secure it to the waist strap.     If no pillow is included, attach the waist strap to the sling and adjust to fit.    Washing Instructions: Straps and sling must be removed and cleaned regularly depending on your activity level and perspiration. Hand wash straps and sling in cold water with mild detergent, rinse, air dry

## 2022-02-26 NOTE — TOC Initial Note (Signed)
Transition of Care [facility]) - Initial/Assessment Note    Patient Details  Name: Lori Lambert MRN: [id number] Date of Birth: 10/17/1945  [facility] CM/SW Contact:    Beverly Sessions, RN Phone Number: 02/26/2022, 2:45 PM  Clinical Narrative:                 Patient was set up with [facility] preoperatively.  [place] from [facility] aware of discharge.  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 Closed fracture of proximal end of left humerus, unspecified fracture morphology, sequela S42.202S Tendinitis of right rotator cuff M75.81 Patient Active Problem List   Diagnosis Date Noted   Age-related osteoporosis without current pathological fracture 04/20/2020   Pulmonary emphysema ([place]) 04/20/2020   Aortic atherosclerosis ([place]) 04/20/2020   Proximal humerus fracture 04/20/2019   Bilateral renal cysts 03/05/2019   Prediabetes 10/20/2017   Tobacco use disorder, moderate, in sustained remission 10/20/2017   Allergic rhinitis 04/17/2017   Vitamin D deficiency 02/22/2016   Family history of cancer of the kidney 05/12/2014   Arthritis of knee, degenerative 05/12/2014   Sleep disorder 06/28/2013   PCP:  Glean Hess, MD Pharmacy:   [facility] [place], [place] [facility] [address] AT [address] [place] [facility] [place] [address] Phone: [phone] Fax: [phone]     Social Determinants of Health (SDOH) Social History: SDOH Screenings   Food Insecurity: No Food Insecurity  (05/23/2021)  Housing: Low Risk  (05/23/2021)  Transportation Needs: No Transportation Needs (05/23/2021)  Alcohol Screen: Low Risk  (05/23/2021)  Depression (PHQ2-9): Medium Risk (07/17/2021)  Financial Resource Strain: Low Risk  (05/23/2021)  Physical Activity: Inactive (05/23/2021)  Social Connections: Socially Isolated (05/23/2021)  Stress: No Stress Concern Present (05/23/2021)  Tobacco Use: Medium Risk (02/26/2022)   SDOH Interventions:     Readmission Risk Interventions     No data to display

## 2022-02-26 NOTE — Op Note (Signed)
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  Post-Op Diagnosis:   Same.  Procedure:   Reverse right total shoulder arthroplasty with biceps tenodesis.  Surgeon:   Pascal Lux, MD  Assistant:   Cameron Proud, PA-C  Anesthesia:   GET  Findings:   As above.  Complications:   None  EBL:   50 cc  Fluids:   800 cc crystalloid  UOP:   None  TT:   None  Drains:   None  Closure:   Staples  Implants:   All press-fit Integra system with a 12 mm stem, a small metaphyseal body, a +0 mm humeral platform, a mini baseplate, and a 38 mm concentric +2 mm laterally offset glenosphere.  Brief Clinical Note:   The patient is a 77 year old female with a long history of progressively worsening right shoulder pain and stiffness following a proximal humerus fracture treated nonsurgically which healed in a malunited position.  The patient was doing well until June, 2023, when she developed significantly increased pain which appears to have resulted from the collapse of the humeral head secondary to AVN.  Her symptoms have progressed despite medications, activity modification, etc her history and examination are consistent with post-traumatic degenerative joint disease with AVN status post a proximal humerus fracture. The patient presents at this time for a reverse right total shoulder arthroplasty with biceps tenodesis.  Procedure:   The patient was brought into the operating room and lain in the supine position. The patient then underwent general endotracheal intubation and anesthesia before the patient was repositioned in the beach chair position using the beach chair positioner. The right shoulder and upper extremity were prepped with ChloraPrep solution before being draped sterilely. Preoperative antibiotics were  administered.   A timeout was performed to verify the appropriate surgical site before a standard anterior approach to the shoulder was made through an approximately 4-5 inch incision. The incision was carried down through the subcutaneous tissues to expose the deltopectoral fascia. The interval between the deltoid and pectoralis muscles was identified and this plane developed, retracting the cephalic vein laterally with the deltoid muscle. The conjoined tendon was identified. Its lateral margin was dissected and the Kolbel self-retraining retractor inserted. The "three sisters" were identified and cauterized. Bursal tissues were removed to improve visualization.   The biceps tendon was identified near the inferior aspect of the bicipital groove. A soft tissue tenodesis was performed by attaching the biceps tendon to the adjacent pectoralis major tendon using two #0 Ethibond interrupted sutures. The biceps tendon was then transected just proximal to the tenodesis site. The subscapularis tendon was released from its attachment to the lesser tuberosity 1 cm proximal to its insertion and several tagging sutures placed. The inferior capsule was released with care after identifying and protecting the axillary nerve. The proximal humeral cut was made at approximately 20 of retroversion using the extra-medullary guide.   Attention was redirected to the glenoid. The labrum was debrided circumferentially before the center of the glenoid was identified. The guidewire was drilled into the glenoid neck using the appropriate guide. After verifying its position, it was overreamed with the mini-baseplate reamer to create a flat surface before the stem reamer was utilized. The superior and inferior peg sites were reamed using the appropriate guide to complete the glenoid preparation. The permanent mini-baseplate was impacted  into place. It was stabilized with a 20 x 4.5 mm central screw and four peripheral screws. Locking  caps were placed over the superior and inferior screws. The permanent 38 mm concentric glenosphere with +2 mm of lateral offset was then impacted into place and its Morse taper locking mechanism verified using manual distraction.  Attention was directed to the humeral side. The humeral canal was prepared utilizing the tapered stem reamers sequentially beginning with the 7 mm stem and progressing to a 12 mm stem. This demonstrated a good tight fit. The metaphyseal region was then prepared using the appropriate planar device. The trial stem and first the medium and then the small metaphyseal body were put together on the back table and trial reductions performed using the +0 mm and +3 mm inserts. With the +0 mm insert, the arm demonstrated excellent range of motion as the hand could be brought across the chest to the opposite shoulder and brought to the top of the patient's head and to the patient's ear. The shoulder appeared stable throughout this range of motion. The trial with the +3 mm insert proved to be too tight. The joint was dislocated and the trial components removed. The permanent 12 mm stem with the small body was impacted into place with care taken to maintain the appropriate version. A repeat trial reduction with the +0 mm insert again demonstrated excellent stability with the findings as described above. Therefore, the shoulder was re-dislocated and, after inserting the locking screw to secure the body to the stem, the permanent +0 mm insert impacted into place. After verifying its locking mechanism, the shoulder was relocated using two finger pressure and again placed through a range of motion with the findings as described above. Of note, there was some posterior superior impingement of the greater tuberosity against the acromion with abduction and external rotation, so a portion of the greater tuberosity was removed to optimize shoulder motion and stability. This was felt not to affect her function  as the superior portion of the rotator cuff was already torn and therefore nonfunctional.  The wound was copiously irrigated with sterile saline solution using the jet lavage system before a "cocktail" consisting of of 20 cc of Exparel, 30 cc of 0.5% Sensorcaine with epinephrine, 2 cc of Kenalog 40 (80 mg), and 15 mg of Toradol was injected into the pericapsular and peri-incisional tissues to help with postoperative analgesia. The subscapularis tendon was not able to be reapproximated as it was too tight. The deltopectoral interval was closed using #0 Vicryl interrupted sutures before the subcutaneous tissues were closed using 2-0 Vicryl interrupted sutures. The skin was closed using staples. Prior to closing the skin, 1 g of transexemic acid in 10 cc of normal saline was injected intra-articularly to help with postoperative bleeding. A sterile occlusive dressing was applied to the wound before the arm was placed into a shoulder immobilizer with an abduction pillow. A Polar Care system also was applied to the shoulder. The patient was then transferred back to a hospital bed before being awakened, extubated, and returned to the recovery room in satisfactory condition after tolerating the procedure well.

## 2022-02-26 NOTE — Transfer of Care (Signed)
Immediate Anesthesia Transfer of Care Note  Patient: Lori Lambert  Procedure(s) Performed: REVERSE SHOULDER ARTHROPLASTY (Right: Shoulder)  Patient Location: PACU  Anesthesia Type:General  Level of Consciousness: awake  Airway & Oxygen Therapy: Patient Spontanous Breathing  Post-op Assessment: Report given to RN and Post -op Vital signs reviewed and stable  Post vital signs: Reviewed and stable  Last Vitals:  Vitals Value Taken Time  BP 110/43 02/26/22 1018  Temp 36.8 C 02/26/22 1018  Pulse 96 02/26/22 1021  Resp 16 02/26/22 1021  SpO2 92 % 02/26/22 1021  Vitals shown include unvalidated device data.  Last Pain:  Vitals:   02/26/22 0630  TempSrc: Temporal  PainSc: 0-No pain         Complications: No notable events documented.

## 2022-02-26 NOTE — Evaluation (Signed)
Occupational Therapy Evaluation Patient Details Name: Lori Lambert MRN: [id number] DOB: February 18, 1945 Today's Date: 02/26/2022   History of Present Illness Pt is 77 y/o female s/p R reverse TSA.   Clinical Impression   Upon entering the room, pt seated in recliner chair with 2 daughters present and agreeable to OT evaluation. OT reviewed polar care system, sling use, NWB precautions, hand/wrist/elbow exercises, and techniques for self care with paper handout provided. Pt ambulates to bathroom with supervision. Min guard for pt to don LB clothing and stand from commode to pull over B hips. Pt ambulates back to room and is seated in recliner chair for education. OT educated family on above mentioned topics with her daughter returning demonstrations to assist with UB dressing and donning of sling and polar care. Pt and family report no further concerns at this time. OT to complete order.      Recommendations for follow up therapy are one component of a multi-disciplinary discharge planning process, led by the attending physician.  Recommendations may be updated based on patient status, additional functional criteria and insurance authorization.   Follow Up Recommendations  Follow physician's recommendations for discharge plan and follow up therapies     Assistance Recommended at Discharge Intermittent Supervision/Assistance  Patient can return home with the following A little help with bathing/dressing/bathroom;Help with stairs or ramp for entrance;Assistance with cooking/housework;Assist for transportation       Equipment Recommendations  None recommended by OT       Precautions / Restrictions Precautions Precautions: Shoulder Shoulder Interventions: Shoulder sling/immobilizer;Don joy ultra sling;Shoulder abduction pillow;At all times;Off for dressing/bathing/exercises Precaution Booklet Issued: Yes (comment) Restrictions Weight Bearing Restrictions: Yes RUE Weight Bearing: Non  weight bearing      Mobility Bed Mobility               General bed mobility comments: seated in recliner chair    Transfers Overall transfer level: Needs assistance Equipment used: None Transfers: Sit to/from Stand Sit to Stand: Supervision                  Balance Overall balance assessment: No apparent balance deficits (not formally assessed)                                         ADL either performed or assessed with clinical judgement   ADL Overall ADL's : Needs assistance/impaired                 Upper Body Dressing : Moderate assistance;Sitting;Cueing for safety;Cueing for sequencing;Adhering to UE precautions   Lower Body Dressing: Min guard;Sit to/from stand   Toilet Transfer: Supervision/safety;Ambulation;Regular Toilet   Toileting- Water quality scientist and Hygiene: Supervision/safety;Sit to/from stand       Functional mobility during ADLs: Supervision/safety       Vision Patient Visual Report: No change from baseline              Pertinent Vitals/Pain Pain Assessment Pain Assessment: Faces Faces Pain Scale: Hurts a little bit Pain Location: R shoulder Pain Descriptors / Indicators: Aching, Discomfort Pain Intervention(s): Limited activity within patient's tolerance, Premedicated before session, Repositioned     Hand Dominance Right   Extremity/Trunk Assessment Upper Extremity Assessment Upper Extremity Assessment: RUE deficits/detail RUE Deficits / Details: surgical intervention - NWB   Lower Extremity Assessment Lower Extremity Assessment: Overall WFL for tasks assessed       Communication  Communication Communication: No difficulties   Cognition Arousal/Alertness: Awake/alert Behavior During Therapy: WFL for tasks assessed/performed Overall Cognitive Status: Within Functional Limits for tasks assessed                                                  Home Living  Family/patient expects to be discharged to:: Private residence Living Arrangements: Children Available Help at Discharge: Family;Available 24 hours/day Type of Home: House Home Access: Stairs to enter [employer] of Steps: 4 Entrance Stairs-Rails: Right;Left Home Layout: Two level;Able to live on main level with bedroom/bathroom Alternate Level Stairs-Number of Steps: Pt does not go upstairs   Bathroom Shower/Tub: Walk-in shower         Home Equipment: None          Prior Functioning/Environment Prior Level of Function : Independent/Modified Independent                                 OT Goals(Current goals can be found in the care plan section) Acute Rehab OT Goals Patient Stated Goal: to go home OT Goal Formulation: With patient/family Time For Goal Achievement: 02/26/22 Potential to Achieve Goals: Good  OT Frequency:         AM-PAC OT "6 Clicks" Daily Activity     Outcome Measure Help from another person eating meals?: None Help from another person taking care of personal grooming?: None Help from another person toileting, which includes using toliet, bedpan, or urinal?: A Little Help from another person bathing (including washing, rinsing, drying)?: A Little Help from another person to put on and taking off regular upper body clothing?: A Lot Help from another person to put on and taking off regular lower body clothing?: A Little 6 Click Score: 19   End of Session Nurse Communication: Mobility status  Activity Tolerance: Patient tolerated treatment well Patient left: in chair;with family/visitor present                   Time: 4128-7867 OT Time Calculation (min): 34 min Charges:  OT General Charges $OT Visit: 1 Visit OT Evaluation $OT Eval Low Complexity: 1 Low OT Treatments $Self Care/Home Management : 23-37 mins  Darleen Crocker, MS, OTR/L , CBIS ascom [phone]  02/26/22, 2:21 PM

## 2022-02-27 ENCOUNTER — Encounter: Payer: Self-pay | Admitting: Surgery

## 2022-02-27 DIAGNOSIS — Z87891 Personal history of nicotine dependence: Secondary | ICD-10-CM | POA: Diagnosis not present

## 2022-02-27 DIAGNOSIS — Z96611 Presence of right artificial shoulder joint: Secondary | ICD-10-CM | POA: Diagnosis not present

## 2022-02-27 DIAGNOSIS — J449 Chronic obstructive pulmonary disease, unspecified: Secondary | ICD-10-CM | POA: Diagnosis not present

## 2022-02-27 DIAGNOSIS — Z9181 History of falling: Secondary | ICD-10-CM | POA: Diagnosis not present

## 2022-02-27 DIAGNOSIS — M80022D Age-related osteoporosis with current pathological fracture, left humerus, subsequent encounter for fracture with routine healing: Secondary | ICD-10-CM | POA: Diagnosis not present

## 2022-02-27 DIAGNOSIS — M1990 Unspecified osteoarthritis, unspecified site: Secondary | ICD-10-CM | POA: Diagnosis not present

## 2022-02-27 DIAGNOSIS — Z7951 Long term (current) use of inhaled steroids: Secondary | ICD-10-CM | POA: Diagnosis not present

## 2022-02-27 DIAGNOSIS — Z471 Aftercare following joint replacement surgery: Secondary | ICD-10-CM | POA: Diagnosis not present

## 2022-02-27 LAB — SURGICAL PATHOLOGY

## 2022-03-01 DIAGNOSIS — J449 Chronic obstructive pulmonary disease, unspecified: Secondary | ICD-10-CM | POA: Diagnosis not present

## 2022-03-01 DIAGNOSIS — M80022D Age-related osteoporosis with current pathological fracture, left humerus, subsequent encounter for fracture with routine healing: Secondary | ICD-10-CM | POA: Diagnosis not present

## 2022-03-01 DIAGNOSIS — Z9181 History of falling: Secondary | ICD-10-CM | POA: Diagnosis not present

## 2022-03-01 DIAGNOSIS — Z7951 Long term (current) use of inhaled steroids: Secondary | ICD-10-CM | POA: Diagnosis not present

## 2022-03-01 DIAGNOSIS — Z96611 Presence of right artificial shoulder joint: Secondary | ICD-10-CM | POA: Diagnosis not present

## 2022-03-01 DIAGNOSIS — Z87891 Personal history of nicotine dependence: Secondary | ICD-10-CM | POA: Diagnosis not present

## 2022-03-01 DIAGNOSIS — M1990 Unspecified osteoarthritis, unspecified site: Secondary | ICD-10-CM | POA: Diagnosis not present

## 2022-03-01 DIAGNOSIS — Z471 Aftercare following joint replacement surgery: Secondary | ICD-10-CM | POA: Diagnosis not present

## 2022-03-04 DIAGNOSIS — Z87891 Personal history of nicotine dependence: Secondary | ICD-10-CM | POA: Diagnosis not present

## 2022-03-04 DIAGNOSIS — J449 Chronic obstructive pulmonary disease, unspecified: Secondary | ICD-10-CM | POA: Diagnosis not present

## 2022-03-04 DIAGNOSIS — M1990 Unspecified osteoarthritis, unspecified site: Secondary | ICD-10-CM | POA: Diagnosis not present

## 2022-03-04 DIAGNOSIS — M80022D Age-related osteoporosis with current pathological fracture, left humerus, subsequent encounter for fracture with routine healing: Secondary | ICD-10-CM | POA: Diagnosis not present

## 2022-03-04 DIAGNOSIS — Z471 Aftercare following joint replacement surgery: Secondary | ICD-10-CM | POA: Diagnosis not present

## 2022-03-04 DIAGNOSIS — Z7951 Long term (current) use of inhaled steroids: Secondary | ICD-10-CM | POA: Diagnosis not present

## 2022-03-04 DIAGNOSIS — Z96611 Presence of right artificial shoulder joint: Secondary | ICD-10-CM | POA: Diagnosis not present

## 2022-03-04 DIAGNOSIS — Z9181 History of falling: Secondary | ICD-10-CM | POA: Diagnosis not present

## 2022-03-06 DIAGNOSIS — Z7951 Long term (current) use of inhaled steroids: Secondary | ICD-10-CM | POA: Diagnosis not present

## 2022-03-06 DIAGNOSIS — Z96611 Presence of right artificial shoulder joint: Secondary | ICD-10-CM | POA: Diagnosis not present

## 2022-03-06 DIAGNOSIS — Z471 Aftercare following joint replacement surgery: Secondary | ICD-10-CM | POA: Diagnosis not present

## 2022-03-06 DIAGNOSIS — Z9181 History of falling: Secondary | ICD-10-CM | POA: Diagnosis not present

## 2022-03-06 DIAGNOSIS — M1990 Unspecified osteoarthritis, unspecified site: Secondary | ICD-10-CM | POA: Diagnosis not present

## 2022-03-06 DIAGNOSIS — M80022D Age-related osteoporosis with current pathological fracture, left humerus, subsequent encounter for fracture with routine healing: Secondary | ICD-10-CM | POA: Diagnosis not present

## 2022-03-06 DIAGNOSIS — Z87891 Personal history of nicotine dependence: Secondary | ICD-10-CM | POA: Diagnosis not present

## 2022-03-06 DIAGNOSIS — J449 Chronic obstructive pulmonary disease, unspecified: Secondary | ICD-10-CM | POA: Diagnosis not present

## 2022-03-08 DIAGNOSIS — Z9181 History of falling: Secondary | ICD-10-CM | POA: Diagnosis not present

## 2022-03-08 DIAGNOSIS — Z7951 Long term (current) use of inhaled steroids: Secondary | ICD-10-CM | POA: Diagnosis not present

## 2022-03-08 DIAGNOSIS — M1990 Unspecified osteoarthritis, unspecified site: Secondary | ICD-10-CM | POA: Diagnosis not present

## 2022-03-08 DIAGNOSIS — Z471 Aftercare following joint replacement surgery: Secondary | ICD-10-CM | POA: Diagnosis not present

## 2022-03-08 DIAGNOSIS — Z87891 Personal history of nicotine dependence: Secondary | ICD-10-CM | POA: Diagnosis not present

## 2022-03-08 DIAGNOSIS — Z96611 Presence of right artificial shoulder joint: Secondary | ICD-10-CM | POA: Diagnosis not present

## 2022-03-08 DIAGNOSIS — M80022D Age-related osteoporosis with current pathological fracture, left humerus, subsequent encounter for fracture with routine healing: Secondary | ICD-10-CM | POA: Diagnosis not present

## 2022-03-08 DIAGNOSIS — J449 Chronic obstructive pulmonary disease, unspecified: Secondary | ICD-10-CM | POA: Diagnosis not present

## 2022-03-12 DIAGNOSIS — M1990 Unspecified osteoarthritis, unspecified site: Secondary | ICD-10-CM | POA: Diagnosis not present

## 2022-03-12 DIAGNOSIS — Z87891 Personal history of nicotine dependence: Secondary | ICD-10-CM | POA: Diagnosis not present

## 2022-03-12 DIAGNOSIS — Z9181 History of falling: Secondary | ICD-10-CM | POA: Diagnosis not present

## 2022-03-12 DIAGNOSIS — Z471 Aftercare following joint replacement surgery: Secondary | ICD-10-CM | POA: Diagnosis not present

## 2022-03-12 DIAGNOSIS — Z96611 Presence of right artificial shoulder joint: Secondary | ICD-10-CM | POA: Diagnosis not present

## 2022-03-12 DIAGNOSIS — M80022D Age-related osteoporosis with current pathological fracture, left humerus, subsequent encounter for fracture with routine healing: Secondary | ICD-10-CM | POA: Diagnosis not present

## 2022-03-12 DIAGNOSIS — Z7951 Long term (current) use of inhaled steroids: Secondary | ICD-10-CM | POA: Diagnosis not present

## 2022-03-12 DIAGNOSIS — J449 Chronic obstructive pulmonary disease, unspecified: Secondary | ICD-10-CM | POA: Diagnosis not present

## 2022-03-13 DIAGNOSIS — Z96611 Presence of right artificial shoulder joint: Secondary | ICD-10-CM | POA: Diagnosis not present

## 2022-03-13 DIAGNOSIS — M6281 Muscle weakness (generalized): Secondary | ICD-10-CM | POA: Diagnosis not present

## 2022-03-13 DIAGNOSIS — M25611 Stiffness of right shoulder, not elsewhere classified: Secondary | ICD-10-CM | POA: Diagnosis not present

## 2022-03-13 DIAGNOSIS — M25511 Pain in right shoulder: Secondary | ICD-10-CM | POA: Diagnosis not present

## 2022-03-18 DIAGNOSIS — M6281 Muscle weakness (generalized): Secondary | ICD-10-CM | POA: Diagnosis not present

## 2022-03-18 DIAGNOSIS — M25511 Pain in right shoulder: Secondary | ICD-10-CM | POA: Diagnosis not present

## 2022-03-18 DIAGNOSIS — M25611 Stiffness of right shoulder, not elsewhere classified: Secondary | ICD-10-CM | POA: Diagnosis not present

## 2022-03-18 DIAGNOSIS — Z96611 Presence of right artificial shoulder joint: Secondary | ICD-10-CM | POA: Diagnosis not present

## 2022-03-25 DIAGNOSIS — Z96611 Presence of right artificial shoulder joint: Secondary | ICD-10-CM | POA: Diagnosis not present

## 2022-03-25 DIAGNOSIS — M6281 Muscle weakness (generalized): Secondary | ICD-10-CM | POA: Diagnosis not present

## 2022-03-25 DIAGNOSIS — M25511 Pain in right shoulder: Secondary | ICD-10-CM | POA: Diagnosis not present

## 2022-03-25 DIAGNOSIS — M25611 Stiffness of right shoulder, not elsewhere classified: Secondary | ICD-10-CM | POA: Diagnosis not present

## 2022-04-01 DIAGNOSIS — M25611 Stiffness of right shoulder, not elsewhere classified: Secondary | ICD-10-CM | POA: Diagnosis not present

## 2022-04-01 DIAGNOSIS — Z471 Aftercare following joint replacement surgery: Secondary | ICD-10-CM | POA: Diagnosis not present

## 2022-04-01 DIAGNOSIS — Z96611 Presence of right artificial shoulder joint: Secondary | ICD-10-CM | POA: Diagnosis not present

## 2022-04-01 DIAGNOSIS — M25511 Pain in right shoulder: Secondary | ICD-10-CM | POA: Diagnosis not present

## 2022-04-01 DIAGNOSIS — M6281 Muscle weakness (generalized): Secondary | ICD-10-CM | POA: Diagnosis not present

## 2022-04-08 DIAGNOSIS — M25511 Pain in right shoulder: Secondary | ICD-10-CM | POA: Diagnosis not present

## 2022-04-08 DIAGNOSIS — M6281 Muscle weakness (generalized): Secondary | ICD-10-CM | POA: Diagnosis not present

## 2022-04-08 DIAGNOSIS — M25611 Stiffness of right shoulder, not elsewhere classified: Secondary | ICD-10-CM | POA: Diagnosis not present

## 2022-04-08 DIAGNOSIS — Z96611 Presence of right artificial shoulder joint: Secondary | ICD-10-CM | POA: Diagnosis not present

## 2022-04-10 DIAGNOSIS — M25611 Stiffness of right shoulder, not elsewhere classified: Secondary | ICD-10-CM | POA: Diagnosis not present

## 2022-04-10 DIAGNOSIS — M87221 Osteonecrosis due to previous trauma, right humerus: Secondary | ICD-10-CM | POA: Diagnosis not present

## 2022-04-10 DIAGNOSIS — M25511 Pain in right shoulder: Secondary | ICD-10-CM | POA: Diagnosis not present

## 2022-04-10 DIAGNOSIS — M6281 Muscle weakness (generalized): Secondary | ICD-10-CM | POA: Diagnosis not present

## 2022-04-10 DIAGNOSIS — Z96611 Presence of right artificial shoulder joint: Secondary | ICD-10-CM | POA: Diagnosis not present

## 2022-04-10 DIAGNOSIS — M19111 Post-traumatic osteoarthritis, right shoulder: Secondary | ICD-10-CM | POA: Diagnosis not present

## 2022-04-16 DIAGNOSIS — M6281 Muscle weakness (generalized): Secondary | ICD-10-CM | POA: Diagnosis not present

## 2022-04-16 DIAGNOSIS — Z96611 Presence of right artificial shoulder joint: Secondary | ICD-10-CM | POA: Diagnosis not present

## 2022-04-16 DIAGNOSIS — M25511 Pain in right shoulder: Secondary | ICD-10-CM | POA: Diagnosis not present

## 2022-04-16 DIAGNOSIS — M25611 Stiffness of right shoulder, not elsewhere classified: Secondary | ICD-10-CM | POA: Diagnosis not present

## 2022-04-18 DIAGNOSIS — Z96611 Presence of right artificial shoulder joint: Secondary | ICD-10-CM | POA: Diagnosis not present

## 2022-04-18 DIAGNOSIS — M25611 Stiffness of right shoulder, not elsewhere classified: Secondary | ICD-10-CM | POA: Diagnosis not present

## 2022-04-18 DIAGNOSIS — M25511 Pain in right shoulder: Secondary | ICD-10-CM | POA: Diagnosis not present

## 2022-04-18 DIAGNOSIS — M6281 Muscle weakness (generalized): Secondary | ICD-10-CM | POA: Diagnosis not present

## 2022-04-23 DIAGNOSIS — M25611 Stiffness of right shoulder, not elsewhere classified: Secondary | ICD-10-CM | POA: Diagnosis not present

## 2022-04-23 DIAGNOSIS — Z96611 Presence of right artificial shoulder joint: Secondary | ICD-10-CM | POA: Diagnosis not present

## 2022-04-23 DIAGNOSIS — M25511 Pain in right shoulder: Secondary | ICD-10-CM | POA: Diagnosis not present

## 2022-04-23 DIAGNOSIS — M6281 Muscle weakness (generalized): Secondary | ICD-10-CM | POA: Diagnosis not present

## 2022-04-25 DIAGNOSIS — M25511 Pain in right shoulder: Secondary | ICD-10-CM | POA: Diagnosis not present

## 2022-04-25 DIAGNOSIS — M6281 Muscle weakness (generalized): Secondary | ICD-10-CM | POA: Diagnosis not present

## 2022-04-25 DIAGNOSIS — Z96611 Presence of right artificial shoulder joint: Secondary | ICD-10-CM | POA: Diagnosis not present

## 2022-04-25 DIAGNOSIS — M25611 Stiffness of right shoulder, not elsewhere classified: Secondary | ICD-10-CM | POA: Diagnosis not present

## 2022-04-30 ENCOUNTER — Ambulatory Visit
Admission: RE | Admit: 2022-04-30 | Discharge: 2022-04-30 | Disposition: A | Discharge status: Home / Self Care | Payer: Medicare HMO | Source: Ambulatory Visit | Attending: Internal Medicine | Admitting: Internal Medicine

## 2022-04-30 DIAGNOSIS — Z87891 Personal history of nicotine dependence: Secondary | ICD-10-CM | POA: Insufficient documentation

## 2022-04-30 DIAGNOSIS — M25611 Stiffness of right shoulder, not elsewhere classified: Secondary | ICD-10-CM | POA: Diagnosis not present

## 2022-04-30 DIAGNOSIS — M25511 Pain in right shoulder: Secondary | ICD-10-CM | POA: Diagnosis not present

## 2022-04-30 DIAGNOSIS — Z96611 Presence of right artificial shoulder joint: Secondary | ICD-10-CM | POA: Diagnosis not present

## 2022-04-30 DIAGNOSIS — M6281 Muscle weakness (generalized): Secondary | ICD-10-CM | POA: Diagnosis not present

## 2022-05-01 ENCOUNTER — Other Ambulatory Visit: Payer: Self-pay

## 2022-05-01 DIAGNOSIS — Z87891 Personal history of nicotine dependence: Secondary | ICD-10-CM

## 2022-05-01 DIAGNOSIS — Z122 Encounter for screening for malignant neoplasm of respiratory organs: Secondary | ICD-10-CM

## 2022-05-02 DIAGNOSIS — M25611 Stiffness of right shoulder, not elsewhere classified: Secondary | ICD-10-CM | POA: Diagnosis not present

## 2022-05-02 DIAGNOSIS — Z96611 Presence of right artificial shoulder joint: Secondary | ICD-10-CM | POA: Diagnosis not present

## 2022-05-02 DIAGNOSIS — M25511 Pain in right shoulder: Secondary | ICD-10-CM | POA: Diagnosis not present

## 2022-05-02 DIAGNOSIS — M6281 Muscle weakness (generalized): Secondary | ICD-10-CM | POA: Diagnosis not present

## 2022-05-07 DIAGNOSIS — M6281 Muscle weakness (generalized): Secondary | ICD-10-CM | POA: Diagnosis not present

## 2022-05-07 DIAGNOSIS — Z96611 Presence of right artificial shoulder joint: Secondary | ICD-10-CM | POA: Diagnosis not present

## 2022-05-07 DIAGNOSIS — M25511 Pain in right shoulder: Secondary | ICD-10-CM | POA: Diagnosis not present

## 2022-05-07 DIAGNOSIS — M25611 Stiffness of right shoulder, not elsewhere classified: Secondary | ICD-10-CM | POA: Diagnosis not present

## 2022-05-09 DIAGNOSIS — M25511 Pain in right shoulder: Secondary | ICD-10-CM | POA: Diagnosis not present

## 2022-05-09 DIAGNOSIS — Z96611 Presence of right artificial shoulder joint: Secondary | ICD-10-CM | POA: Diagnosis not present

## 2022-05-09 DIAGNOSIS — M25611 Stiffness of right shoulder, not elsewhere classified: Secondary | ICD-10-CM | POA: Diagnosis not present

## 2022-05-09 DIAGNOSIS — M6281 Muscle weakness (generalized): Secondary | ICD-10-CM | POA: Diagnosis not present

## 2022-05-12 ENCOUNTER — Other Ambulatory Visit: Payer: Self-pay | Admitting: Internal Medicine

## 2022-05-12 DIAGNOSIS — J439 Emphysema, unspecified: Secondary | ICD-10-CM

## 2022-05-14 DIAGNOSIS — Z96611 Presence of right artificial shoulder joint: Secondary | ICD-10-CM | POA: Diagnosis not present

## 2022-05-14 DIAGNOSIS — M25511 Pain in right shoulder: Secondary | ICD-10-CM | POA: Diagnosis not present

## 2022-05-14 DIAGNOSIS — M6281 Muscle weakness (generalized): Secondary | ICD-10-CM | POA: Diagnosis not present

## 2022-05-14 DIAGNOSIS — M25611 Stiffness of right shoulder, not elsewhere classified: Secondary | ICD-10-CM | POA: Diagnosis not present

## 2022-05-17 DIAGNOSIS — Z96611 Presence of right artificial shoulder joint: Secondary | ICD-10-CM | POA: Diagnosis not present

## 2022-05-17 DIAGNOSIS — M25611 Stiffness of right shoulder, not elsewhere classified: Secondary | ICD-10-CM | POA: Diagnosis not present

## 2022-05-17 DIAGNOSIS — M6281 Muscle weakness (generalized): Secondary | ICD-10-CM | POA: Diagnosis not present

## 2022-05-17 DIAGNOSIS — M25511 Pain in right shoulder: Secondary | ICD-10-CM | POA: Diagnosis not present

## 2022-05-21 DIAGNOSIS — M25511 Pain in right shoulder: Secondary | ICD-10-CM | POA: Diagnosis not present

## 2022-05-21 DIAGNOSIS — M25611 Stiffness of right shoulder, not elsewhere classified: Secondary | ICD-10-CM | POA: Diagnosis not present

## 2022-05-21 DIAGNOSIS — Z96611 Presence of right artificial shoulder joint: Secondary | ICD-10-CM | POA: Diagnosis not present

## 2022-05-21 DIAGNOSIS — M6281 Muscle weakness (generalized): Secondary | ICD-10-CM | POA: Diagnosis not present

## 2022-05-22 DIAGNOSIS — Z96611 Presence of right artificial shoulder joint: Secondary | ICD-10-CM | POA: Diagnosis not present

## 2022-05-23 DIAGNOSIS — M6281 Muscle weakness (generalized): Secondary | ICD-10-CM | POA: Diagnosis not present

## 2022-05-23 DIAGNOSIS — Z96611 Presence of right artificial shoulder joint: Secondary | ICD-10-CM | POA: Diagnosis not present

## 2022-05-23 DIAGNOSIS — M25511 Pain in right shoulder: Secondary | ICD-10-CM | POA: Diagnosis not present

## 2022-05-23 DIAGNOSIS — M25611 Stiffness of right shoulder, not elsewhere classified: Secondary | ICD-10-CM | POA: Diagnosis not present

## 2022-05-28 DIAGNOSIS — M6281 Muscle weakness (generalized): Secondary | ICD-10-CM | POA: Diagnosis not present

## 2022-05-28 DIAGNOSIS — M25511 Pain in right shoulder: Secondary | ICD-10-CM | POA: Diagnosis not present

## 2022-05-28 DIAGNOSIS — Z96611 Presence of right artificial shoulder joint: Secondary | ICD-10-CM | POA: Diagnosis not present

## 2022-05-28 DIAGNOSIS — M25611 Stiffness of right shoulder, not elsewhere classified: Secondary | ICD-10-CM | POA: Diagnosis not present

## 2022-05-29 ENCOUNTER — Other Ambulatory Visit: Payer: Self-pay | Admitting: Internal Medicine

## 2022-05-29 ENCOUNTER — Ambulatory Visit (INDEPENDENT_AMBULATORY_CARE_PROVIDER_SITE_OTHER): Payer: Medicare HMO

## 2022-05-29 VITALS — BP 136/72 | Ht 62.0 in | Wt 117.2 lb

## 2022-05-29 DIAGNOSIS — Z0000 Encounter for general adult medical examination without abnormal findings: Secondary | ICD-10-CM

## 2022-05-29 NOTE — Progress Notes (Signed)
Subjective:   Lori Lambert is a 77 y.o. female who presents for Medicare Annual (Subsequent) preventive examination.  Review of Systems     Cardiac Risk Factors include: advanced age (>95men, >48 women);dyslipidemia     Objective:    Today's Vitals   05/29/22 1457  BP: 136/72  Weight: 117 lb 3.2 oz (53.2 kg)  Height: 5\' 2"  (1.575 m)   Body mass index is 21.44 kg/m.     05/29/2022    3:08 PM 02/26/2022    6:26 AM 02/14/2022    9:53 AM 06/29/2021   11:44 AM 05/23/2021    3:02 PM 05/22/2020    3:36 PM 05/17/2019    3:32 PM  Advanced Directives  Does Patient Have a Medical Advance Directive? No No No No No No No  Would patient like information on creating a medical advance directive? No - Patient declined No - Patient declined   Yes (MAU/Ambulatory/Procedural Areas - Information given) Yes (MAU/Ambulatory/Procedural Areas - Information given) Yes (MAU/Ambulatory/Procedural Areas - Information given)    Current Medications (verified) Outpatient Encounter Medications as of 05/29/2022  Medication Sig   acetaminophen (TYLENOL) 325 MG tablet Take 650 mg by mouth every 6 (six) hours as needed.   albuterol (VENTOLIN HFA) 108 (90 Base) MCG/ACT inhaler Inhale 2 puffs into the lungs every 4 (four) hours as needed.   alendronate (FOSAMAX) 70 MG tablet Take 1 tablet (70 mg total) by mouth every 7 (seven) days. Take with a full glass of water on an empty stomach. Sunday   atorvastatin (LIPITOR) 10 MG tablet Take 1 tablet (10 mg total) by mouth daily.   Cholecalciferol (VITAMIN D3) 5000 units TABS Take 1 tablet by mouth daily.   SYMBICORT 160-4.5 MCG/ACT inhaler INHALE 2 PUFFS TWICE DAILY   baclofen (LIORESAL) 10 MG tablet TAKE 1 TABLET(10 MG) BY MOUTH AT BEDTIME AS NEEDED FOR MUSCLE SPASMS (Patient not taking: Reported on 05/29/2022)   nystatin (MYCOSTATIN) 100000 UNIT/ML suspension Take 5 mLs (500,000 Units total) by mouth 4 (four) times daily. (Patient not taking: Reported on 05/29/2022)    oxyCODONE (ROXICODONE) 5 MG immediate release tablet Take 1-2 tablets (5-10 mg total) by mouth every 4 (four) hours as needed for moderate pain or severe pain. (Patient not taking: Reported on 05/29/2022)   No facility-administered encounter medications on file as of 05/29/2022.    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   Procedure: REVERSE SHOULDER ARTHROPLASTY;  Surgeon: Christena Flake, MD;  Location: [facility] ORS;  Service: Orthopedics;  Laterality: Right;   Family History  Problem Relation Age of Onset   Heart attack Mother    Heart attack Father    Renal cancer Sister    Social History   Socioeconomic History   Marital status: Single    Spouse name: Not on file   Number of children: 2   Years of education: Not  on file   Highest education level: Not on file  Occupational History   Not on file  Tobacco Use   Smoking status: Former    Packs/day: 1.00    Years: 40.00    Additional pack years: 0.00    Total pack years: 40.00    Types: Cigarettes    Quit date: 07/13/2014    Years since quitting: 7.8   Smokeless tobacco: Never  Vaping Use   Vaping Use: Never used  Substance and Sexual Activity   Alcohol use: No   Drug use: No   Sexual activity: Not on file  Other Topics Concern   Not on file  Social History Narrative   Pt's daughter lives with her.    Social Determinants of Health   Financial Resource Strain: Low Risk  (05/29/2022)    Overall Financial Resource Strain (CARDIA)    Difficulty of Paying Living Expenses: Not hard at all  Food Insecurity: No Food Insecurity (05/29/2022)   Hunger Vital Sign    Worried About Running Out of Food in the Last Year: Never true    Ran Out of Food in the Last Year: Never true  Transportation Needs: No Transportation Needs (05/29/2022)   PRAPARE - Administrator, Civil Service (Medical): No    Lack of Transportation (Non-Medical): No  Physical Activity: Insufficiently Active (05/29/2022)   Exercise Vital Sign    Days of Exercise per Week: 2 days    Minutes of Exercise per Session: 60 min  Stress: No Stress Concern Present (05/29/2022)   [employer] of Occupational Health - Occupational Stress Questionnaire    Feeling of Stress : Only a little  Social Connections: Moderately Isolated (05/29/2022)   Social Connection and Isolation Panel [NHANES]    Frequency of Communication with Friends and Family: More than three times a week    Frequency of Social Gatherings with Friends and Family: Twice a week    Attends Religious Services: More than 4 times per year    Active Member of [employer] or Organizations: No    Attends Banker Meetings: Never    Marital Status: Widowed    Tobacco Counseling Counseling given: Not Answered   Clinical Intake:  Pre-visit preparation completed: Yes  Pain : No/denies pain     Nutritional Risks: None Diabetes: No  How often do you need to have someone help you when you read instructions, pamphlets, or other written materials from your doctor or pharmacy?: 1 - Never  Diabetic?no  Interpreter Needed?: No  Information entered by :: Kennedy Bucker, LPN   Activities of Daily Living    05/29/2022    3:11 PM 02/14/2022    9:56 AM  In your present state of health, do you have any difficulty performing the following activities:  Hearing? 0   Vision? 0   Difficulty concentrating or making decisions? 0   Walking or climbing  stairs? 0   Dressing or bathing? 0   Doing errands, shopping? 0 0  Preparing Food and eating ? N   Using the Toilet? N   In the past six months, have you accidently leaked urine? N   Do you have problems with loss of bowel control? N   Managing your Medications? N   Managing your Finances? N   Housekeeping or managing your Housekeeping? N     Patient Care Team: Reubin Milan, MD as PCP - General (Internal Medicine)  Indicate any recent Medical Services you may have received  from other than [facility] providers in the past year (date may be approximate).     Assessment:   This is a routine wellness examination for Wilmetta.  Hearing/Vision screen Hearing Screening - Comments:: No aids Vision Screening - Comments:: No glasses- Dr.Shade  Dietary issues and exercise activities discussed: Current Exercise Habits: Home exercise routine, Type of exercise: stretching (P.T. for shoulder), Time (Minutes): 60, Frequency (Times/Week): 2, Weekly Exercise (Minutes/Week): 120, Intensity: Mild   Goals Addressed             This Visit's Progress    DIET - EAT MORE FRUITS AND VEGETABLES         Depression Screen    05/29/2022    3:07 PM 07/17/2021    1:19 PM 05/23/2021    3:01 PM 04/05/2021   10:30 AM 09/06/2020   10:47 AM 05/22/2020    3:35 PM 04/20/2020    8:22 AM  PHQ 2/9 Scores  PHQ - 2 Score 2 0 0 0 0 0 0  PHQ- 9 Score 3 5 1 3 2  2     Fall Risk    05/29/2022    3:09 PM 07/17/2021    1:19 PM 05/23/2021    3:03 PM 04/05/2021   10:30 AM 09/06/2020   10:47 AM  Fall Risk   Falls in the past year? 0 0 0 0 0  Number falls in past yr: 0 0 0 0 0  Injury with Fall? 0 0 0 0 0  Risk for fall due to : No Fall Risks No Fall Risks No Fall Risks No Fall Risks No Fall Risks  Follow up Falls prevention discussed Falls evaluation completed Falls prevention discussed Falls evaluation completed Falls evaluation completed    FALL RISK PREVENTION PERTAINING TO THE HOME:  Any stairs in or around the  home? Yes  If so, are there any without handrails? No  Home free of loose throw rugs in walkways, pet beds, electrical cords, etc? Yes  Adequate lighting in your home to reduce risk of falls? Yes   ASSISTIVE DEVICES UTILIZED TO PREVENT FALLS:  Life alert? No  Use of a cane, walker or w/c? No  Grab bars in the bathroom? Yes  Shower chair or bench in shower? Yes  Elevated toilet seat or a handicapped toilet? No   TIMED UP AND GO:  Was the test performed? Yes .  Length of time to ambulate 10 feet: 4 sec.   Gait steady and fast without use of assistive device  Cognitive Function:        05/29/2022    3:17 PM  6CIT Screen  What Year? 0 points  What month? 0 points  What time? 0 points  Count back from 20 0 points  Months in reverse 0 points  Repeat phrase 4 points  Total Score 4 points    Immunizations Immunization History  Administered Date(s) Administered   Fluad Quad(high Dose 65+) 11/23/2020, 12/04/2021   Influenza, High Dose Seasonal PF 10/20/2017   Influenza, Seasonal, Injecte, Preservative Fre 01/03/2009, 02/27/2011, 10/28/2012   Influenza-Unspecified 10/29/2018, 10/29/2019   Moderna Sars-Covid-2 Vaccination 08/20/2019, 09/17/2019   Pneumococcal Conjugate-13 02/21/2016   Pneumococcal Polysaccharide-23 02/01/2011   Tdap 04/18/2014    TDAP status: Up to date  Flu Vaccine status: Up to date  Pneumococcal vaccine status: Up to date  Covid-19 vaccine status: Completed vaccines  Qualifies for Shingles Vaccine? Yes   Zostavax completed No   Shingrix Completed?: No.    Education has been  provided regarding the importance of this vaccine. Patient has been advised to call insurance company to determine out of pocket expense if they have not yet received this vaccine. Advised may also receive vaccine at local pharmacy or Health Dept. Verbalized acceptance and understanding.  Screening Tests Health Maintenance  Topic Date Due   Zoster Vaccines- Shingrix (1 of 2)  Never done   COVID-19 Vaccine (3 - 2023-24 season) 09/28/2021   MAMMOGRAM  07/17/2022   INFLUENZA VACCINE  08/29/2022   Lung Cancer Screening  04/30/2023   Medicare Annual Wellness (AWV)  05/29/2023   DTaP/Tdap/Td (2 - Td or Tdap) 04/17/2024   Pneumonia Vaccine 14+ Years old  Completed   DEXA SCAN  Completed   Hepatitis C Screening  Completed   HPV VACCINES  Aged Out   COLONOSCOPY (Pts 45-54yrs Insurance coverage will need to be confirmed)  Discontinued    Health Maintenance  Health Maintenance Due  Topic Date Due   Zoster Vaccines- Shingrix (1 of 2) Never done   COVID-19 Vaccine (3 - 2023-24 season) 09/28/2021    Colorectal cancer screening: No longer required.   Mammogram status: No longer required due to age. Had one on 07/16/21  Bone Density status: Completed 07/16/21. Results reflect: Bone density results: OSTEOPOROSIS. Repeat every 2 years.  Lung Cancer Screening: (Low Dose CT Chest recommended if Age 32-80 years, 30 pack-year currently smoking OR have quit w/in 15years.) does qualify.   Lung Cancer Screening Referral: had on 04/30/22  Additional Screening:  Hepatitis C Screening: does qualify; Completed 02/21/16  Vision Screening: Recommended annual ophthalmology exams for early detection of glaucoma and other disorders of the eye. Is the patient up to date with their annual eye exam?  Yes  Who is the provider or what is the name of the office in which the patient attends annual eye exams? Dr.Shade If pt is not established with a provider, would they like to be referred to a provider to establish care? No .   Dental Screening: Recommended annual dental exams for proper oral hygiene  Community Resource Referral / Chronic Care Management: CRR required this visit?  No   CCM required this visit?  No      Plan:     I have personally reviewed and noted the following in the patient's chart:   Medical and social history Use of alcohol, tobacco or illicit drugs   Current medications and supplements including opioid prescriptions. Patient is not currently taking opioid prescriptions. Functional ability and status Nutritional status Physical activity Advanced directives List of other physicians Hospitalizations, surgeries, and ER visits in previous 12 months Vitals Screenings to include cognitive, depression, and falls Referrals and appointments  In addition, I have reviewed and discussed with patient certain preventive protocols, quality metrics, and best practice recommendations. A written personalized care plan for preventive services as well as general preventive health recommendations were provided to patient.     Hal Hope, LPN   02/02/1094   Nurse Notes: none

## 2022-05-29 NOTE — Patient Instructions (Signed)
Lori Lambert , Thank you for taking time to come for your Medicare Wellness Visit. I appreciate your ongoing commitment to your health goals. Please review the following plan we discussed and let me know if I can assist you in the future.   These are the goals we discussed:  Goals      DIET - EAT MORE FRUITS AND VEGETABLES     DIET - INCREASE WATER INTAKE     Recommend drinking 6-8 glasses of water per day         This is a list of the screening recommended for you and due dates:  Health Maintenance  Topic Date Due   Zoster (Shingles) Vaccine (1 of 2) Never done   COVID-19 Vaccine (3 - 2023-24 season) 09/28/2021   Mammogram  07/17/2022   Flu Shot  08/29/2022   Screening for Lung Cancer  04/30/2023   Medicare Annual Wellness Visit  05/29/2023   DTaP/Tdap/Td vaccine (2 - Td or Tdap) 04/17/2024   Pneumonia Vaccine  Completed   DEXA scan (bone density measurement)  Completed   Hepatitis C Screening: USPSTF Recommendation to screen - Ages 32-79 yo.  Completed   HPV Vaccine  Aged Out   Colon Cancer Screening  Discontinued    Advanced directives: no  Conditions/risks identified: none  Next appointment: Follow up in one year for your annual wellness visit 06/04/23 @ 2:00 pm in person   Preventive Care 65 Years and Older, Female Preventive care refers to lifestyle choices and visits with your health care provider that can promote health and wellness. What does preventive care include? A yearly physical exam. This is also called an annual well check. Dental exams once or twice a year. Routine eye exams. Ask your health care provider how often you should have your eyes checked. Personal lifestyle choices, including: Daily care of your teeth and gums. Regular physical activity. Eating a healthy diet. Avoiding tobacco and drug use. Limiting alcohol use. Practicing safe sex. Taking low-dose aspirin every day. Taking vitamin and mineral supplements as recommended by your health care  provider. What happens during an annual well check? The services and screenings done by your health care provider during your annual well check will depend on your age, overall health, lifestyle risk factors, and family history of disease. Counseling  Your health care provider may ask you questions about your: Alcohol use. Tobacco use. Drug use. Emotional well-being. Home and relationship well-being. Sexual activity. Eating habits. History of falls. Memory and ability to understand (cognition). Work and work Astronomer. Reproductive health. Screening  You may have the following tests or measurements: Height, weight, and BMI. Blood pressure. Lipid and cholesterol levels. These may be checked every 5 years, or more frequently if you are over 46 years old. Skin check. Lung cancer screening. You may have this screening every year starting at age 68 if you have a 30-pack-year history of smoking and currently smoke or have quit within the past 15 years. Fecal occult blood test (FOBT) of the stool. You may have this test every year starting at age 61. Flexible sigmoidoscopy or colonoscopy. You may have a sigmoidoscopy every 5 years or a colonoscopy every 10 years starting at age 30. Hepatitis C blood test. Hepatitis B blood test. Sexually transmitted disease (STD) testing. Diabetes screening. This is done by checking your blood sugar (glucose) after you have not eaten for a while (fasting). You may have this done every 1-3 years. Bone density scan. This is done to screen  for osteoporosis. You may have this done starting at age 49. Mammogram. This may be done every 1-2 years. Talk to your health care provider about how often you should have regular mammograms. Talk with your health care provider about your test results, treatment options, and if necessary, the need for more tests. Vaccines  Your health care provider may recommend certain vaccines, such as: Influenza vaccine. This is  recommended every year. Tetanus, diphtheria, and acellular pertussis (Tdap, Td) vaccine. You may need a Td booster every 10 years. Zoster vaccine. You may need this after age 33. Pneumococcal 13-valent conjugate (PCV13) vaccine. One dose is recommended after age 46. Pneumococcal polysaccharide (PPSV23) vaccine. One dose is recommended after age 75. Talk to your health care provider about which screenings and vaccines you need and how often you need them. This information is not intended to replace advice given to you by your health care provider. Make sure you discuss any questions you have with your health care provider. Document Released: 02/10/2015 Document Revised: 10/04/2015 Document Reviewed: 11/15/2014 Elsevier Interactive Patient Education  2017 [place] Prevention in the Home Falls can cause injuries. They can happen to people of all ages. There are many things you can do to make your home safe and to help prevent falls. What can I do on the outside of my home? Regularly fix the edges of walkways and driveways and fix any cracks. Remove anything that might make you trip as you walk through a door, such as a raised step or threshold. Trim any bushes or trees on the path to your home. Use bright outdoor lighting. Clear any walking paths of anything that might make someone trip, such as rocks or tools. Regularly check to see if handrails are loose or broken. Make sure that both sides of any steps have handrails. Any raised decks and porches should have guardrails on the edges. Have any leaves, snow, or ice cleared regularly. Use sand or salt on walking paths during winter. Clean up any spills in your garage right away. This includes oil or grease spills. What can I do in the bathroom? Use night lights. Install grab bars by the toilet and in the tub and shower. Do not use towel bars as grab bars. Use non-skid mats or decals in the tub or shower. If you need to sit down in  the shower, use a plastic, non-slip stool. Keep the floor dry. Clean up any water that spills on the floor as soon as it happens. Remove soap buildup in the tub or shower regularly. Attach bath mats securely with double-sided non-slip rug tape. Do not have throw rugs and other things on the floor that can make you trip. What can I do in the bedroom? Use night lights. Make sure that you have a light by your bed that is easy to reach. Do not use any sheets or blankets that are too big for your bed. They should not hang down onto the floor. Have a firm chair that has side arms. You can use this for support while you get dressed. Do not have throw rugs and other things on the floor that can make you trip. What can I do in the kitchen? Clean up any spills right away. Avoid walking on wet floors. Keep items that you use a lot in easy-to-reach places. If you need to reach something above you, use a strong step stool that has a grab bar. Keep electrical cords out of the way. Do  not use floor polish or wax that makes floors slippery. If you must use wax, use non-skid floor wax. Do not have throw rugs and other things on the floor that can make you trip. What can I do with my stairs? Do not leave any items on the stairs. Make sure that there are handrails on both sides of the stairs and use them. Fix handrails that are broken or loose. Make sure that handrails are as long as the stairways. Check any carpeting to make sure that it is firmly attached to the stairs. Fix any carpet that is loose or worn. Avoid having throw rugs at the top or bottom of the stairs. If you do have throw rugs, attach them to the floor with carpet tape. Make sure that you have a light switch at the top of the stairs and the bottom of the stairs. If you do not have them, ask someone to add them for you. What else can I do to help prevent falls? Wear shoes that: Do not have high heels. Have rubber bottoms. Are comfortable  and fit you well. Are closed at the toe. Do not wear sandals. If you use a stepladder: Make sure that it is fully opened. Do not climb a closed stepladder. Make sure that both sides of the stepladder are locked into place. Ask someone to hold it for you, if possible. Clearly mark and make sure that you can see: Any grab bars or handrails. First and last steps. Where the edge of each step is. Use tools that help you move around (mobility aids) if they are needed. These include: Canes. Walkers. Scooters. Crutches. Turn on the lights when you go into a dark area. Replace any light bulbs as soon as they burn out. Set up your furniture so you have a clear path. Avoid moving your furniture around. If any of your floors are uneven, fix them. If there are any pets around you, be aware of where they are. Review your medicines with your doctor. Some medicines can make you feel dizzy. This can increase your chance of falling. Ask your doctor what other things that you can do to help prevent falls. This information is not intended to replace advice given to you by your health care provider. Make sure you discuss any questions you have with your health care provider. Document Released: 11/10/2008 Document Revised: 06/22/2015 Document Reviewed: 02/18/2014 Elsevier Interactive Patient Education  2017 [employer].

## 2022-05-30 ENCOUNTER — Other Ambulatory Visit: Payer: Self-pay | Admitting: Internal Medicine

## 2022-05-30 ENCOUNTER — Ambulatory Visit: Payer: Self-pay

## 2022-05-30 DIAGNOSIS — J439 Emphysema, unspecified: Secondary | ICD-10-CM

## 2022-05-30 DIAGNOSIS — M62830 Muscle spasm of back: Secondary | ICD-10-CM

## 2022-05-30 DIAGNOSIS — M25511 Pain in right shoulder: Secondary | ICD-10-CM | POA: Diagnosis not present

## 2022-05-30 DIAGNOSIS — Z96611 Presence of right artificial shoulder joint: Secondary | ICD-10-CM | POA: Diagnosis not present

## 2022-05-30 DIAGNOSIS — M6281 Muscle weakness (generalized): Secondary | ICD-10-CM | POA: Diagnosis not present

## 2022-05-30 DIAGNOSIS — M25611 Stiffness of right shoulder, not elsewhere classified: Secondary | ICD-10-CM | POA: Diagnosis not present

## 2022-05-30 NOTE — Telephone Encounter (Signed)
Reason for Disposition . [1] MODERATE pain (e.g., interferes with normal activities) AND [2] present > 3 days  Protocols used: Muscle Aches and Body Pain-A-AH

## 2022-05-30 NOTE — Telephone Encounter (Signed)
Medication Refill - Medication: albuterol (VENTOLIN HFA) 108 (90 Base) MCG/ACT inhaler and a muscle relaxer for muscle spasms.  Pt was unsure of the name of the medication.  Pt stated she had an appointment yesterday, and refills were supposed to be called in for her.   Has the patient contacted their pharmacy? Yes.    (Agent: If yes, when and what did the pharmacy advise?)  Preferred Pharmacy (with phone number or street name):  [id number] [place] - [address] AT [facility] OF [address]  [address] [place] [address]  Phone: [phone] Fax: [phone]  Hours: Not open 24 hours   Has the patient been seen for an appointment in the last year OR does the patient have an upcoming appointment? Yes.    Agent: Please be advised that RX refills may take up to 3 business days. We ask that you follow-up with your pharmacy.

## 2022-05-30 NOTE — Telephone Encounter (Addendum)
Patient called, left VM to return the call to the office to speak to a nurse.  Requested medication for back spasms.Baclofen is on her medication list. Would need to triage to address the back spasms since it was last refilled 2022.

## 2022-05-30 NOTE — Telephone Encounter (Addendum)
  Chief Complaint: back spasms  Symptoms: back spasms lower back, comes and goes d/t long activity Frequency: 2 weeks  Pertinent Negatives: NA Disposition: [] ED /[] Urgent Care (no appt availability in office) / [x] Appointment(In office/virtual)/ []  [place] [facility]/ [] Home Care/ [] Refused Recommended Disposition /[] [place] [facility]/ []  Follow-up with PCP Additional Notes: pt requesting refill on baclofen for back spasms since she has had this before. Scheduled her OV since last RF was 2022, advised pt I would send request back for refill and to check with pharmacy tomorrow. Pt scheduled appt for 06/04/22 at 1320 with PCP.   Answer Assessment - Initial Assessment Questions 1. ONSET: "When did the muscle aches or body pains start?"      2 weeks  2. LOCATION: "What part of your body is hurting?" (e.g., entire body, arms, legs)      Lower back  3. SEVERITY: "How bad is the pain?" (Scale 1-10; or mild, moderate, severe)   - MILD (1-3): doesn't interfere with normal activities    - MODERATE (4-7): interferes with normal activities or awakens from sleep    - SEVERE (8-10):  excruciating pain, unable to do any normal activities      Spasms comes and goes when walking long periods  4. CAUSE: "What do you think is causing the pains?"     Back spasms  6. OTHER SYMPTOMS: "Do you have any other symptoms?" (e.g., chest pain, weakness, rash, cold or flu symptoms, weight loss)  Protocols used: Muscle Aches and Body Pain-A-AH

## 2022-05-31 MED ORDER — ALBUTEROL SULFATE HFA 108 (90 BASE) MCG/ACT IN AERS: 2.0000 | INHALATION_SPRAY | RESPIRATORY_TRACT | 1 refills | Status: AC | PRN

## 2022-05-31 MED ORDER — BACLOFEN 10 MG PO TABS: ORAL_TABLET | ORAL | 0 refills | Status: DC

## 2022-05-31 NOTE — Telephone Encounter (Signed)
Requested Prescriptions  Pending Prescriptions Disp Refills   albuterol (VENTOLIN HFA) 108 (90 Base) MCG/ACT inhaler 1 each 1    Sig: Inhale 2 puffs into the lungs every 4 (four) hours as needed.     Pulmonology:  Beta Agonists 2 Passed - 05/30/2022  5:46 PM      Passed - Last BP in normal range    BP Readings from Last 1 Encounters:  05/29/22 136/72         Passed - Last Heart Rate in normal range    Pulse Readings from Last 1 Encounters:  02/26/22 84         Passed - Valid encounter within last 12 months    Recent Outpatient Visits           10 months ago Pain in right upper arm   [place] [facility] at [facility], Nyoka Cowden, MD   1 year ago Annual physical exam   [facility] at [facility], Nyoka Cowden, MD   1 year ago COVID-19   [facility] at [facility], Ocie Bob, MD   2 years ago Annual physical exam   [facility] Care & Sports Medicine at [facility], Nyoka Cowden, MD   3 years ago Annual physical exam   [facility] Primary Care & Sports Medicine at [facility], Nyoka Cowden, MD       Future Appointments             In 4 days Reubin Milan, MD [facility] Primary Care & Sports Medicine at [facility]             baclofen (LIORESAL) 10 MG tablet 90 tablet 0    Sig: TAKE 1 TABLET(10 MG) BY MOUTH AT BEDTIME AS NEEDED FOR MUSCLE SPASMS     Analgesics:  Muscle Relaxants - baclofen Passed - 05/30/2022  5:46 PM      Passed - Cr in normal range and within 180 days    Creatinine  Date Value Ref Range Status  01/31/2011 0.64 0.60 - 1.30 mg/dL Final   Creatinine, Ser  Date Value Ref Range Status  02/14/2022 0.86 0.44 - 1.00 mg/dL Final         Passed - eGFR is 30 or above and within 180 days    EGFR (African American)  Date Value Ref Range Status  01/31/2011 >60 >47mL/min Final   GFR calc Af Amer   Date Value Ref Range Status  04/20/2019 82 >59 mL/min/1.73 Final   EGFR (Non-African Amer.)  Date Value Ref Range Status  01/31/2011 >60 >83mL/min Final    Comment:    eGFR values <44mL/min/1.73 m2 may be an indication of chronic kidney disease (CKD). Calculated eGFR, using the MRDR Study equation, is useful in  patients with stable renal function. The eGFR calculation will not be reliable in acutely ill patients when serum creatinine is changing rapidly. It is not useful in patients on dialysis. The eGFR calculation may not be applicable to patients at the low and high extremes of body sizes, pregnant women, and vegetarians.    GFR, Estimated  Date Value Ref Range Status  02/14/2022 >60 >60 mL/min Final    Comment:    (NOTE) Calculated using the CKD-EPI Creatinine Equation (2021)    eGFR  Date Value Ref Range Status  04/05/2021 76 >59 mL/min/1.73 Final  Passed - Valid encounter within last 6 months    Recent Outpatient Visits           10 months ago Pain in right upper arm   [place] [facility] at [facility], Nyoka Cowden, MD   1 year ago Annual physical exam   [facility] Primary Care & Sports Medicine at [facility], Nyoka Cowden, MD   1 year ago COVID-19   [facility] Primary Care & Sports Medicine at [facility], Ocie Bob, MD   2 years ago Annual physical exam   [facility] Primary Care & Sports Medicine at [facility], Nyoka Cowden, MD   3 years ago Annual physical exam   [facility] Primary Care & Sports Medicine at [facility], Nyoka Cowden, MD       Future Appointments             In 4 days Judithann Graves Nyoka Cowden, MD [facility] Primary Care & Sports Medicine at [facility], [facility]

## 2022-06-03 DIAGNOSIS — M25611 Stiffness of right shoulder, not elsewhere classified: Secondary | ICD-10-CM | POA: Diagnosis not present

## 2022-06-03 DIAGNOSIS — M25511 Pain in right shoulder: Secondary | ICD-10-CM | POA: Diagnosis not present

## 2022-06-03 DIAGNOSIS — M6281 Muscle weakness (generalized): Secondary | ICD-10-CM | POA: Diagnosis not present

## 2022-06-03 DIAGNOSIS — Z96611 Presence of right artificial shoulder joint: Secondary | ICD-10-CM | POA: Diagnosis not present

## 2022-06-04 ENCOUNTER — Ambulatory Visit (INDEPENDENT_AMBULATORY_CARE_PROVIDER_SITE_OTHER): Payer: Medicare HMO | Admitting: Internal Medicine

## 2022-06-04 ENCOUNTER — Encounter: Payer: Self-pay | Admitting: Internal Medicine

## 2022-06-04 VITALS — BP 132/78 | HR 90 | Ht 62.0 in | Wt 119.0 lb

## 2022-06-04 DIAGNOSIS — B354 Tinea corporis: Secondary | ICD-10-CM

## 2022-06-04 DIAGNOSIS — M62830 Muscle spasm of back: Secondary | ICD-10-CM | POA: Diagnosis not present

## 2022-06-04 DIAGNOSIS — J439 Emphysema, unspecified: Secondary | ICD-10-CM

## 2022-06-04 DIAGNOSIS — I700 Atherosclerosis of aorta: Secondary | ICD-10-CM

## 2022-06-04 MED ORDER — CLOTRIMAZOLE-BETAMETHASONE 1-0.05 % EX CREA: 1.0000 | TOPICAL_CREAM | Freq: Every day | CUTANEOUS | 0 refills | Status: DC

## 2022-06-04 MED ORDER — METHOCARBAMOL 500 MG PO TABS: 500.0000 mg | ORAL_TABLET | Freq: Four times a day (QID) | ORAL | 0 refills | Status: DC | PRN

## 2022-06-04 NOTE — Progress Notes (Signed)
Date:  06/04/2022   Name:  Lori Lambert   DOB:  Jun 14, 1945   MRN:  [id number]   Chief Complaint: Back Pain  Back Pain This is a chronic problem. The current episode started more than 1 year ago. The problem has been waxing and waning since onset. The pain is present in the lumbar spine. The quality of the pain is described as cramping and aching. The pain does not radiate. The pain is moderate. The pain is The same all the time. Pertinent negatives include no chest pain. Treatments tried: baclofen.  Rash This is a new problem. The current episode started 1 to 4 weeks ago. The problem has been gradually worsening since onset. The affected locations include the right axilla. The rash is characterized by burning, itchiness and redness. Pertinent negatives include no fatigue or shortness of breath.    Lab Results  Component Value Date   NA 142 02/14/2022   K 3.4 (L) 02/14/2022   CO2 30 02/14/2022   GLUCOSE 130 (H) 02/14/2022   BUN 19 02/14/2022   CREATININE 0.86 02/14/2022   CALCIUM 9.1 02/14/2022   EGFR 76 04/05/2021   GFRNONAA >60 02/14/2022   Lab Results  Component Value Date   CHOL 169 04/05/2021   HDL 59 04/05/2021   LDLCALC 90 04/05/2021   TRIG 109 04/05/2021   CHOLHDL 2.9 04/05/2021   Lab Results  Component Value Date   TSH 2.870 04/05/2021   Lab Results  Component Value Date   HGBA1C 6.1 (H) 04/05/2021   Lab Results  Component Value Date   WBC 8.8 02/14/2022   HGB 13.6 02/14/2022   HCT 40.9 02/14/2022   MCV 95.3 02/14/2022   PLT 319 02/14/2022   Lab Results  Component Value Date   ALT 15 02/14/2022   AST 20 02/14/2022   ALKPHOS 53 02/14/2022   BILITOT 0.7 02/14/2022   Lab Results  Component Value Date   VD25OH 38.7 04/05/2021     Review of Systems  Constitutional:  Negative for chills and fatigue.  Respiratory:  Negative for chest tightness and shortness of breath.   Cardiovascular:  Negative for chest pain, palpitations and leg swelling.   Musculoskeletal:  Positive for arthralgias and back pain. Negative for joint swelling and myalgias.  Skin:  Positive for rash.  Psychiatric/Behavioral:  Negative for dysphoric mood and sleep disturbance. The patient is not nervous/anxious.     Patient Active Problem List   Diagnosis Date Noted   Age-related osteoporosis without current pathological fracture 04/20/2020   Pulmonary emphysema (HCC) 04/20/2020   Aortic atherosclerosis (HCC) 04/20/2020   Proximal humerus fracture 04/20/2019   Bilateral renal cysts 03/05/2019   Prediabetes 10/20/2017   Tobacco use disorder, moderate, in sustained remission 10/20/2017   Allergic rhinitis 04/17/2017   Vitamin D deficiency 02/22/2016   Family history of cancer of the kidney 05/12/2014   Arthritis of knee, degenerative 05/12/2014   Sleep disorder 06/28/2013    Allergies  Allergen Reactions   Sulfa Antibiotics Rash    Past Surgical History:  Procedure Laterality Date   ABDOMINAL HYSTERECTOMY  1976   bladder tack  2005   COLONOSCOPY     ORIF FOREARM FRACTURE Right 2011   wrist   REVERSE SHOULDER ARTHROPLASTY Right 02/26/2022   Procedure: REVERSE SHOULDER ARTHROPLASTY;  Surgeon: Christena Flake, MD;  Location: [facility] ORS;  Service: Orthopedics;  Laterality: Right;    Social History   Tobacco Use   Smoking status: Former  Packs/day: 1.00    Years: 40.00    Additional pack years: 0.00    Total pack years: 40.00    Types: Cigarettes    Quit date: 07/13/2014    Years since quitting: 7.8   Smokeless tobacco: Never  Vaping Use   Vaping Use: Never used  Substance Use Topics   Alcohol use: No   Drug use: No     Medication list has been reviewed and updated.  Current Meds  Medication Sig   acetaminophen (TYLENOL) 325 MG tablet Take 650 mg by mouth every 6 (six) hours as needed.   albuterol (VENTOLIN HFA) 108 (90 Base) MCG/ACT inhaler Inhale 2 puffs into the lungs every 4 (four) hours as needed.   alendronate (FOSAMAX) 70 MG  tablet Take 1 tablet (70 mg total) by mouth every 7 (seven) days. Take with a full glass of water on an empty stomach. Sunday   atorvastatin (LIPITOR) 10 MG tablet Take 1 tablet (10 mg total) by mouth daily.   Cholecalciferol (VITAMIN D3) 5000 units TABS Take 1 tablet by mouth daily.   clotrimazole-betamethasone (LOTRISONE) cream Apply 1 Application topically daily.   methocarbamol (ROBAXIN) 500 MG tablet Take 1 tablet (500 mg total) by mouth every 6 (six) hours as needed for muscle spasms.   SYMBICORT 160-4.5 MCG/ACT inhaler INHALE 2 PUFFS TWICE DAILY   [DISCONTINUED] baclofen (LIORESAL) 10 MG tablet TAKE 1 TABLET(10 MG) BY MOUTH AT BEDTIME AS NEEDED FOR MUSCLE SPASMS       06/04/2022    1:27 PM 07/17/2021    1:19 PM 04/05/2021   10:30 AM 09/06/2020   10:48 AM  GAD 7 : Generalized Anxiety Score  Nervous, Anxious, on Edge 0 0 0 0  Control/stop worrying 0 0 0 0  Worry too much - different things 0 0 0 0  Trouble relaxing 0 0 0 0  Restless 0 0 0 0  Easily annoyed or irritable 0 0 0 0  Afraid - awful might happen 0 0 0 0  Total GAD 7 Score 0 0 0 0  Anxiety Difficulty Not difficult at all Not difficult at all Not difficult at all        06/04/2022    1:27 PM 05/29/2022    3:07 PM 07/17/2021    1:19 PM  Depression screen PHQ 2/9  Decreased Interest 0 1 0  Down, Depressed, Hopeless 0 1 0  PHQ - 2 Score 0 2 0  Altered sleeping 0 1 3  Tired, decreased energy 2  2  Change in appetite 3  0  Feeling bad or failure about yourself  1  0  Trouble concentrating 0  0  Moving slowly or fidgety/restless 0  0  Suicidal thoughts 0  0  PHQ-9 Score 6 3 5   Difficult doing work/chores Not difficult at all Not difficult at all Not difficult at all    BP Readings from Last 3 Encounters:  06/04/22 132/78  05/29/22 136/72  02/26/22 104/60    Physical Exam Vitals and nursing note reviewed.  Constitutional:      General: She is not in acute distress.    Appearance: Normal appearance. She is  well-developed.  HENT:     Head: Normocephalic and atraumatic.  Cardiovascular:     Rate and Rhythm: Normal rate and regular rhythm.     Pulses: Normal pulses.     Heart sounds: No murmur heard. Pulmonary:     Effort: Pulmonary effort is normal. No respiratory distress.  Breath sounds: No wheezing or rhonchi.  Musculoskeletal:     Cervical back: Normal range of motion.     Thoracic back: Spasms present. Normal range of motion. No scoliosis.     Lumbar back: Negative right straight leg raise test and negative left straight leg raise test.     Right lower leg: No edema.     Left lower leg: No edema.  Skin:    General: Skin is warm and dry.     Findings: Rash present.     Comments: Tinea rash in right axilla  Neurological:     Mental Status: She is alert and oriented to person, place, and time.  Psychiatric:        Mood and Affect: Mood normal.        Behavior: Behavior normal.     Wt Readings from Last 3 Encounters:  06/04/22 119 lb (54 kg)  05/29/22 117 lb 3.2 oz (53.2 kg)  02/26/22 119 lb 14.9 oz (54.4 kg)    BP 132/78   Pulse 90   Ht 5\' 2"  (1.575 m)   Wt 119 lb (54 kg)   SpO2 94%   BMI 21.77 kg/m   Assessment and Plan:  Problem List Items Addressed This Visit       Cardiovascular and Mediastinum   Aortic atherosclerosis (HCC) (Chronic)    On appropriate statin therapy. Lab Results  Component Value Date   LDLCALC 90 04/05/2021          Respiratory   Pulmonary emphysema (HCC) (Chronic)   Other Visit Diagnoses     Spasm of thoracic back muscle    -  Primary   continue Aleve and add heat   Relevant Medications   methocarbamol (ROBAXIN) 500 MG tablet   Tinea corporis       try to allow air flow apply topical twice a day   Relevant Medications   clotrimazole-betamethasone (LOTRISONE) cream       Return in about 3 months (around 09/04/2022) for lipids, blood sugar.   Partially dictated using Dragon software, any errors are not  intentional.  Reubin Milan, MD [facility] Primary Care and Sports Medicine [place], [place]

## 2022-06-04 NOTE — Patient Instructions (Signed)
Use heat for 30 minutes 3-4 times a day to your back.  Stop Baclofen.  Begin Robaxin 500 mg up the four times per day.  Continue Aleve.

## 2022-06-04 NOTE — Assessment & Plan Note (Signed)
On appropriate statin therapy. Lab Results  Component Value Date   LDLCALC 90 04/05/2021

## 2022-06-05 DIAGNOSIS — M6281 Muscle weakness (generalized): Secondary | ICD-10-CM | POA: Diagnosis not present

## 2022-06-05 DIAGNOSIS — Z96611 Presence of right artificial shoulder joint: Secondary | ICD-10-CM | POA: Diagnosis not present

## 2022-06-05 DIAGNOSIS — M25611 Stiffness of right shoulder, not elsewhere classified: Secondary | ICD-10-CM | POA: Diagnosis not present

## 2022-06-05 DIAGNOSIS — M25511 Pain in right shoulder: Secondary | ICD-10-CM | POA: Diagnosis not present

## 2022-06-11 DIAGNOSIS — M25611 Stiffness of right shoulder, not elsewhere classified: Secondary | ICD-10-CM | POA: Diagnosis not present

## 2022-06-11 DIAGNOSIS — M25511 Pain in right shoulder: Secondary | ICD-10-CM | POA: Diagnosis not present

## 2022-06-11 DIAGNOSIS — M6281 Muscle weakness (generalized): Secondary | ICD-10-CM | POA: Diagnosis not present

## 2022-06-11 DIAGNOSIS — Z96611 Presence of right artificial shoulder joint: Secondary | ICD-10-CM | POA: Diagnosis not present

## 2022-06-19 DIAGNOSIS — M25611 Stiffness of right shoulder, not elsewhere classified: Secondary | ICD-10-CM | POA: Diagnosis not present

## 2022-06-19 DIAGNOSIS — Z96611 Presence of right artificial shoulder joint: Secondary | ICD-10-CM | POA: Diagnosis not present

## 2022-06-19 DIAGNOSIS — M25511 Pain in right shoulder: Secondary | ICD-10-CM | POA: Diagnosis not present

## 2022-06-19 DIAGNOSIS — M6281 Muscle weakness (generalized): Secondary | ICD-10-CM | POA: Diagnosis not present

## 2022-06-27 DIAGNOSIS — M25611 Stiffness of right shoulder, not elsewhere classified: Secondary | ICD-10-CM | POA: Diagnosis not present

## 2022-06-27 DIAGNOSIS — M6281 Muscle weakness (generalized): Secondary | ICD-10-CM | POA: Diagnosis not present

## 2022-06-27 DIAGNOSIS — M25511 Pain in right shoulder: Secondary | ICD-10-CM | POA: Diagnosis not present

## 2022-06-27 DIAGNOSIS — Z96611 Presence of right artificial shoulder joint: Secondary | ICD-10-CM | POA: Diagnosis not present

## 2022-07-10 DIAGNOSIS — Z96611 Presence of right artificial shoulder joint: Secondary | ICD-10-CM | POA: Diagnosis not present

## 2022-07-10 DIAGNOSIS — M19111 Post-traumatic osteoarthritis, right shoulder: Secondary | ICD-10-CM | POA: Diagnosis not present

## 2022-07-10 DIAGNOSIS — M87221 Osteonecrosis due to previous trauma, right humerus: Secondary | ICD-10-CM | POA: Diagnosis not present

## 2022-09-20 ENCOUNTER — Other Ambulatory Visit: Payer: Self-pay

## 2022-09-20 DIAGNOSIS — B354 Tinea corporis: Secondary | ICD-10-CM

## 2022-09-20 MED ORDER — CLOTRIMAZOLE-BETAMETHASONE 1-0.05 % EX CREA: 1.0000 | TOPICAL_CREAM | Freq: Every day | CUTANEOUS | 0 refills | Status: AC

## 2022-10-21 ENCOUNTER — Other Ambulatory Visit: Payer: Self-pay | Admitting: Internal Medicine

## 2022-10-21 DIAGNOSIS — M810 Age-related osteoporosis without current pathological fracture: Secondary | ICD-10-CM

## 2022-10-21 DIAGNOSIS — I700 Atherosclerosis of aorta: Secondary | ICD-10-CM

## 2022-11-19 ENCOUNTER — Ambulatory Visit: Payer: Medicare HMO

## 2022-11-20 ENCOUNTER — Ambulatory Visit (INDEPENDENT_AMBULATORY_CARE_PROVIDER_SITE_OTHER): Payer: Medicare HMO

## 2022-11-20 ENCOUNTER — Other Ambulatory Visit: Payer: Self-pay

## 2022-11-20 ENCOUNTER — Telehealth: Payer: Self-pay | Admitting: Internal Medicine

## 2022-11-20 DIAGNOSIS — M62830 Muscle spasm of back: Secondary | ICD-10-CM

## 2022-11-20 DIAGNOSIS — Z23 Encounter for immunization: Secondary | ICD-10-CM

## 2022-11-20 MED ORDER — METHOCARBAMOL 500 MG PO TABS: 500.0000 mg | ORAL_TABLET | Freq: Four times a day (QID) | ORAL | 0 refills | Status: DC | PRN

## 2022-11-20 NOTE — Telephone Encounter (Signed)
Patient is requesting medication for Methocarbamol 500mg  tablets send to [facility].

## 2022-11-20 NOTE — Telephone Encounter (Signed)
Refill sent.  Zena Vitelli 

## 2022-12-17 ENCOUNTER — Other Ambulatory Visit: Payer: Self-pay | Admitting: Internal Medicine

## 2022-12-17 DIAGNOSIS — I700 Atherosclerosis of aorta: Secondary | ICD-10-CM

## 2022-12-17 DIAGNOSIS — M810 Age-related osteoporosis without current pathological fracture: Secondary | ICD-10-CM

## 2022-12-18 NOTE — Telephone Encounter (Signed)
Requested medication (s) are due for refill today: yes   Requested medication (s) are on the active medication list: yes   Last refill:  fosamax 10/21/22 #12 0 refills, lipitor- 10/21/22 #90 0 refills   Future visit scheduled: no   Notes to clinic:  protocol failed. Last labs 04/05/21. Do you want to continue refills Rx?     Requested Prescriptions  Pending Prescriptions Disp Refills   alendronate (FOSAMAX) 70 MG tablet [Pharmacy Med Name: Alendronate Sodium Oral Tablet 70 MG] 12 tablet 3    Sig: TAKE 1 TABLET EVERY 7 DAYS WITH A FULL GLASS OF WATER ON AN EMPTY STOMACH     Endocrinology:  Bisphosphonates Failed - 12/17/2022 12:40 PM      Failed - Vitamin D in normal range and within 360 days    Vit D, 25-Hydroxy  Date Value Ref Range Status  04/05/2021 38.7 30.0 - 100.0 ng/mL Final    Comment:    Vitamin D deficiency has been defined by the Institute of Medicine and an Endocrine Society practice guideline as a level of serum 25-OH vitamin D less than 20 ng/mL (1,2). The Endocrine Society went on to further define vitamin D insufficiency as a level between 21 and 29 ng/mL (2). 1. IOM (Institute of Medicine). 2010. Dietary reference    intakes for calcium and D. [place]: The    [employer]. 2. Holick MF, Binkley [place], Bischoff-Ferrari HA, et al.    Evaluation, treatment, and prevention of vitamin D    deficiency: an Endocrine Society clinical practice    guideline. JCEM. 2011 Jul; 96(7):1911-30.          Failed - Mg Level in normal range and within 360 days    No results found for: "MG"       Failed - Phosphate in normal range and within 360 days    No results found for: "PHOS"       Passed - Ca in normal range and within 360 days    Calcium  Date Value Ref Range Status  02/14/2022 9.1 8.9 - 10.3 mg/dL Final   Calcium, Total  Date Value Ref Range Status  01/31/2011 8.2 (L) 8.5 - 10.1 mg/dL Final         Passed - Cr in normal range and within 360 days     Creatinine  Date Value Ref Range Status  01/31/2011 0.64 0.60 - 1.30 mg/dL Final   Creatinine, Ser  Date Value Ref Range Status  02/14/2022 0.86 0.44 - 1.00 mg/dL Final         Passed - eGFR is 30 or above and within 360 days    EGFR (African American)  Date Value Ref Range Status  01/31/2011 >60 >5mL/min Final   GFR calc Af Amer  Date Value Ref Range Status  04/20/2019 82 >59 mL/min/1.73 Final   EGFR (Non-African Amer.)  Date Value Ref Range Status  01/31/2011 >60 >75mL/min Final    Comment:    eGFR values <29mL/min/1.73 m2 may be an indication of chronic kidney disease (CKD). Calculated eGFR, using the MRDR Study equation, is useful in  patients with stable renal function. The eGFR calculation will not be reliable in acutely ill patients when serum creatinine is changing rapidly. It is not useful in patients on dialysis. The eGFR calculation may not be applicable to patients at the low and high extremes of body sizes, pregnant women, and vegetarians.    GFR, Estimated  Date Value Ref Range Status  02/14/2022 >60 >60 mL/min Final    Comment:    (NOTE) Calculated using the CKD-EPI Creatinine Equation (2021)    eGFR  Date Value Ref Range Status  04/05/2021 76 >59 mL/min/1.73 Final         Passed - Valid encounter within last 12 months    Recent Outpatient Visits           6 months ago Spasm of thoracic back muscle   [place] [facility] at [facility], Nyoka Cowden, MD   1 year ago Pain in right upper arm   [place] [facility], Nyoka Cowden, MD   1 year ago Annual physical exam   [facility] Primary Care & Sports Medicine at [facility], Nyoka Cowden, MD   2 years ago COVID-19   [facility] Primary Care & Sports Medicine at [facility], Ocie Bob, MD   2 years ago Annual physical exam   [facility] Primary Care & Sports Medicine at [facility], Nyoka Cowden, MD              Passed - Bone Mineral Density or Dexa Scan completed in the last 2 years       atorvastatin (LIPITOR) 10 MG tablet [Pharmacy Med Name: Atorvastatin Calcium Oral Tablet 10 MG] 90 tablet 3    Sig: TAKE 1 TABLET EVERY DAY     Cardiovascular:  Antilipid - Statins Failed - 12/17/2022 12:40 PM      Failed - Lipid Panel in normal range within the last 12 months    Cholesterol, Total  Date Value Ref Range Status  04/05/2021 169 100 - 199 mg/dL Final   LDL Chol Calc (NIH)  Date Value Ref Range Status  04/05/2021 90 0 - 99 mg/dL Final   HDL  Date Value Ref Range Status  04/05/2021 59 >39 mg/dL Final   Triglycerides  Date Value Ref Range Status  04/05/2021 109 0 - 149 mg/dL Final         Passed - Patient is not pregnant      Passed - Valid encounter within last 12 months    Recent Outpatient Visits           6 months ago Spasm of thoracic back muscle   [place] [facility] at [facility], Nyoka Cowden, MD   1 year ago Pain in right upper arm   [place] [facility] Medicine at [facility], Nyoka Cowden, MD   1 year ago Annual physical exam   [facility] at [facility], Nyoka Cowden, MD   2 years ago COVID-19   [facility] Primary Care & Sports Medicine at [facility], Ocie Bob, MD   2 years ago Annual physical exam   [facility] Primary Care & Sports Medicine at [facility], Nyoka Cowden, MD

## 2023-02-05 DIAGNOSIS — M25511 Pain in right shoulder: Secondary | ICD-10-CM | POA: Diagnosis not present

## 2023-02-05 DIAGNOSIS — Z96611 Presence of right artificial shoulder joint: Secondary | ICD-10-CM | POA: Diagnosis not present

## 2023-02-05 DIAGNOSIS — M87221 Osteonecrosis due to previous trauma, right humerus: Secondary | ICD-10-CM | POA: Diagnosis not present

## 2023-02-05 DIAGNOSIS — M19111 Post-traumatic osteoarthritis, right shoulder: Secondary | ICD-10-CM | POA: Diagnosis not present

## 2023-05-02 ENCOUNTER — Ambulatory Visit
Admission: EM | Admit: 2023-05-02 | Discharge: 2023-05-02 | Disposition: A | Discharge status: Home / Self Care | Attending: Family Medicine | Admitting: Family Medicine

## 2023-05-02 ENCOUNTER — Ambulatory Visit (INDEPENDENT_AMBULATORY_CARE_PROVIDER_SITE_OTHER)

## 2023-05-02 ENCOUNTER — Encounter: Payer: Self-pay | Admitting: Emergency Medicine

## 2023-05-02 DIAGNOSIS — S60211A Contusion of right wrist, initial encounter: Secondary | ICD-10-CM | POA: Diagnosis not present

## 2023-05-02 DIAGNOSIS — M25531 Pain in right wrist: Secondary | ICD-10-CM

## 2023-05-02 DIAGNOSIS — S52501A Unspecified fracture of the lower end of right radius, initial encounter for closed fracture: Secondary | ICD-10-CM

## 2023-05-02 NOTE — ED Triage Notes (Signed)
 Patient states that she fell last Sunday.  Patient reports pain in her right wrist and forearm.

## 2023-05-02 NOTE — ED Provider Notes (Signed)
 [facility]    CSN: [id number] Arrival date & time: 05/02/23  1154      History   Chief Complaint Chief Complaint  Patient presents with   Fall    HPI Lori Lambert is a 78 y.o. female presents for wrist pain after a fall.  Patient reports 1 week ago she missed a curb and fell forward onto an outstretched right hand/wrist.  No head injury or LOC.  Reports since then she has had some swelling and bruising as well as some pain to her right wrist that extends into her forearm.  No numbness or tingling.  She is not taking blood thinning medications.  Does report a history of right wrist fractures x 3 that did require 1 surgery with pins.  She did take some ibuprofen for symptoms.  No other concerns at this time.   Fall    Past Medical History:  Diagnosis Date   Aortic atherosclerosis (HCC)    Closed fracture of right proximal humerus 03/24/2019   COPD (chronic obstructive pulmonary disease) (HCC)    Family history of adverse reaction to anesthesia    sister has N/V   Hypercholesterolemia    Osteoporosis    Pneumonia    Pre-diabetes     Patient Active Problem List   Diagnosis Date Noted   Age-related osteoporosis without current pathological fracture 04/20/2020   Pulmonary emphysema (HCC) 04/20/2020   Aortic atherosclerosis (HCC) 04/20/2020   Proximal humerus fracture 04/20/2019   Bilateral renal cysts 03/05/2019   Prediabetes 10/20/2017   Tobacco use disorder, moderate, in sustained remission 10/20/2017   Allergic rhinitis 04/17/2017   Vitamin D deficiency 02/22/2016   Family history of cancer of the kidney 05/12/2014   Arthritis of knee, degenerative 05/12/2014   Sleep disorder 06/28/2013    Past Surgical History:  Procedure Laterality Date   ABDOMINAL HYSTERECTOMY  1976   bladder tack  2005   COLONOSCOPY     ORIF FOREARM FRACTURE Right 2011   wrist   REVERSE SHOULDER ARTHROPLASTY Right 02/26/2022   Procedure: REVERSE SHOULDER ARTHROPLASTY;   Surgeon: Christena Flake, MD;  Location: [facility];  Service: Orthopedics;  Laterality: Right;    OB History   No obstetric history on file.      Home Medications    Prior to Admission medications   Medication Sig Start Date End Date Taking? Authorizing Provider  acetaminophen (TYLENOL) 325 MG tablet Take 650 mg by mouth every 6 (six) hours as needed.    [provider]  albuterol (VENTOLIN HFA) 108 (90 Base) MCG/ACT inhaler Inhale 2 puffs into the lungs every 4 (four) hours as needed. 05/31/22   Reubin Milan, MD  alendronate (FOSAMAX) 70 MG tablet TAKE 1 TABLET EVERY 7 DAYS WITH A FULL GLASS OF WATER ON AN EMPTY STOMACH 12/19/22   Reubin Milan, MD  atorvastatin (LIPITOR) 10 MG tablet TAKE 1 TABLET EVERY DAY 12/19/22   Reubin Milan, MD  Cholecalciferol (VITAMIN D3) 5000 units TABS Take 1 tablet by mouth Lambert.    [provider]  clotrimazole-betamethasone (LOTRISONE) cream Apply 1 Application topically Lambert. 09/20/22   Reubin Milan, MD  methocarbamol (ROBAXIN) 500 MG tablet Take 1 tablet (500 mg total) by mouth every 6 (six) hours as needed for muscle spasms. 11/20/22   Reubin Milan, MD  SYMBICORT 160-4.5 MCG/ACT inhaler INHALE 2 PUFFS TWICE Lambert 05/13/22   Reubin Milan, MD    Family History Family History  Problem  Relation Age of Onset   Heart attack Mother    Heart attack Father    Renal cancer Sister     Social History Social History   Tobacco Use   Smoking status: Former    Current packs/day: 0.00    Average packs/day: 1 pack/day for 40.0 years (40.0 ttl pk-yrs)    Types: Cigarettes    Start date: 07/13/1974    Quit date: 07/13/2014    Years since quitting: 8.8   Smokeless tobacco: Never  Vaping Use   Vaping status: Never Used  Substance Use Topics   Alcohol use: No   Drug use: No     Allergies   Sulfa antibiotics   Review of Systems Review of Systems  Musculoskeletal:        Right wrist pain/fall       Physical Exam Triage Vital Signs ED Triage Vitals  Encounter Vitals Group     BP 05/02/23 1225 125/77     Systolic BP Percentile --      Diastolic BP Percentile --      Pulse Rate 05/02/23 1225 73     Resp 05/02/23 1225 15     Temp 05/02/23 1225 98.4 F (36.9 C)     Temp Source 05/02/23 1225 Oral     SpO2 05/02/23 1225 95 %     Weight 05/02/23 1223 119 lb 0.8 oz (54 kg)     Height 05/02/23 1223 5\' 2"  (1.575 m)     Head Circumference --      Peak Flow --      Pain Score 05/02/23 1223 2     Pain Loc --      Pain Education --      Exclude from Growth Chart --    No data found.  Updated Vital Signs BP 125/77 (BP Location: Left Arm)   Pulse 73   Temp 98.4 F (36.9 C) (Oral)   Resp 15   Ht 5\' 2"  (1.575 m)   Wt 119 lb 0.8 oz (54 kg)   SpO2 95%   BMI 21.77 kg/m   Visual Acuity Right Eye Distance:   Left Eye Distance:   Bilateral Distance:    Right Eye Near:   Left Eye Near:    Bilateral Near:     Physical Exam Vitals and nursing note reviewed.  Constitutional:      General: She is not in acute distress.    Appearance: Normal appearance. She is not ill-appearing.  HENT:     Head: Normocephalic and atraumatic.  Eyes:     Pupils: Pupils are equal, round, and reactive to light.  Cardiovascular:     Rate and Rhythm: Normal rate.  Pulmonary:     Effort: Pulmonary effort is normal.  Musculoskeletal:     Right wrist: Swelling present. No effusion, lacerations, bony tenderness, snuff box tenderness or crepitus. Decreased range of motion. Normal pulse.     Comments: There is fading ecchymosis from the mid right forearm that extends to the proximal fingers.  There is no tenderness with palpation to the hand.  There is mild tenderness to the dorsum of the wrist when she dorsiflex.  No tenderness to forearm.  Skin:    General: Skin is warm and dry.  Neurological:     General: No focal deficit present.     Mental Status: She is alert and oriented to person, place,  and time.  Psychiatric:        Mood and Affect: Mood  normal.        Behavior: Behavior normal.      UC Treatments / Results  Labs (all labs ordered are listed, but only abnormal results are displayed) Labs Reviewed - No data to display  EKG   Radiology DG Wrist Complete Right Result Date: 05/02/2023 CLINICAL DATA:  Right wrist pain after fall 5 days ago. EXAM: RIGHT WRIST - COMPLETE 3+ VIEW COMPARISON:  None Available. FINDINGS: Moderate degenerative changes involve the first carpometacarpal joint. Deformity of distal right radius is noted consistent with moderately angulated fracture. Possible intra-articular extension may be present. IMPRESSION: Deformity of distal right radius consistent with moderately angulated fracture with possible intra-articular extension. Electronically Signed   By: Lupita Raider M.D.   On: 05/02/2023 13:50   DG Forearm Right Result Date: 05/02/2023 CLINICAL DATA:  Right forearm pain after fall five days ago. EXAM: RIGHT FOREARM - 2 VIEW COMPARISON:  None Available. FINDINGS: The ulna is unremarkable. Moderately angulated fracture of distal right radius is noted. IMPRESSION: Moderately angulated distal right radial fracture is noted. Electronically Signed   By: Lupita Raider M.D.   On: 05/02/2023 13:48    Procedures Procedures (including critical care time)  Medications Ordered in UC Medications - No data to display  Initial Impression / Assessment and Plan / UC Course  I have reviewed the triage vital signs and the nursing notes.  Pertinent labs & imaging results that were available during my care of the patient were reviewed by me and considered in my medical decision making (see chart for details).     Reviewed exam and symptoms with patient.  X-ray shows distal right radial fracture.  Patient placed in Velcro thumb spica advised to keep in place until seen by orthopedics, will have follow-up with orthopedics next week, contact information  provided.  Discussed RICE therapy and OTC analgesic such as Tylenol as needed.  PCP follow-up 2 to 3 days for recheck.  ER precautions reviewed and patient verbalized understanding. Final Clinical Impressions(s) / UC Diagnoses   Final diagnoses:  Right wrist pain  Contusion of right wrist, initial encounter  Closed fracture of distal end of right radius, unspecified fracture morphology, initial encounter     Discharge Instructions      Keep your wrist in the splint to help with swelling and to support the wrist until you are seen by orthopedics. Follow up with orthopedics next week asap. You may elevate and ice as needed. Over the counter tylenol as needed. Please follow up with your PCP in 2-3 days for re-check. Please go to the ER for any worsening symptoms. I hope you feel better soon!     ED Prescriptions   None    PDMP not reviewed this encounter.   Radford Pax, NP 05/02/23 1356

## 2023-05-02 NOTE — Discharge Instructions (Addendum)
 Keep your wrist in the splint to help with swelling and to support the wrist until you are seen by orthopedics. Follow up with orthopedics next week asap. You may elevate and ice as needed. Over the counter tylenol as needed. Please follow up with your PCP in 2-3 days for re-check. Please go to the ER for any worsening symptoms. I hope you feel better soon!

## 2023-05-07 ENCOUNTER — Other Ambulatory Visit: Payer: Self-pay | Admitting: Acute Care

## 2023-05-07 DIAGNOSIS — Z87891 Personal history of nicotine dependence: Secondary | ICD-10-CM

## 2023-05-07 DIAGNOSIS — Z122 Encounter for screening for malignant neoplasm of respiratory organs: Secondary | ICD-10-CM

## 2023-05-15 ENCOUNTER — Ambulatory Visit
Admission: RE | Admit: 2023-05-15 | Discharge: 2023-05-15 | Disposition: A | Discharge status: Home / Self Care | Source: Ambulatory Visit | Attending: Acute Care | Admitting: Acute Care

## 2023-05-15 DIAGNOSIS — Z87891 Personal history of nicotine dependence: Secondary | ICD-10-CM | POA: Insufficient documentation

## 2023-05-15 DIAGNOSIS — Z122 Encounter for screening for malignant neoplasm of respiratory organs: Secondary | ICD-10-CM | POA: Insufficient documentation

## 2023-05-30 ENCOUNTER — Other Ambulatory Visit: Payer: Self-pay | Admitting: Internal Medicine

## 2023-05-30 DIAGNOSIS — J439 Emphysema, unspecified: Secondary | ICD-10-CM

## 2023-06-02 NOTE — Telephone Encounter (Signed)
 Requested medication (s) are due for refill today: yes  Requested medication (s) are on the active medication list: yes  Last refill:  05/13/22  Future visit scheduled: no  Notes to clinic:  Unable to refill per protocol, courtesy refill already given, routing for provider approval.      Requested Prescriptions  Pending Prescriptions Disp Refills   SYMBICORT  160-4.5 MCG/ACT inhaler [Pharmacy Med Name: Symbicort  Inhalation Aerosol 160-4.5 MCG/ACT] 3 each 3    Sig: INHALE 2 PUFFS TWICE DAILY     Pulmonology:  Combination Products Failed - 06/02/2023  2:34 PM      Failed - Valid encounter within last 12 months    Recent Outpatient Visits   None

## 2023-06-02 NOTE — Telephone Encounter (Signed)
 Please review

## 2023-06-04 ENCOUNTER — Ambulatory Visit: Payer: Medicare HMO | Admitting: Emergency Medicine

## 2023-06-04 VITALS — Ht 62.0 in | Wt 121.0 lb

## 2023-06-04 DIAGNOSIS — Z0000 Encounter for general adult medical examination without abnormal findings: Secondary | ICD-10-CM | POA: Diagnosis not present

## 2023-06-04 DIAGNOSIS — Z780 Asymptomatic menopausal state: Secondary | ICD-10-CM

## 2023-06-04 DIAGNOSIS — Z1231 Encounter for screening mammogram for malignant neoplasm of breast: Secondary | ICD-10-CM

## 2023-06-04 NOTE — Patient Instructions (Addendum)
 Lori Lambert , Thank you for taking time to come for your Medicare Wellness Visit. I appreciate your ongoing commitment to your health goals. Please review the following plan we discussed and let me know if I can assist you in the future.   Referrals/Orders/Follow-Ups/Clinician Recommendations:  I have placed orders for a mammogram and bone density test (due 07/18/23). Call [facility] @ [phone] to schedule at your convenience. Get the shingles vaccines at your local pharmacy. I scheduled you an appointment with Dr. Gala Jubilee for 06/09/23 @ 2:40pm. You were last seen on 06/04/22.   This is a list of the screening recommended for you and due dates:  Health Maintenance  Topic Date Due   Zoster (Shingles) Vaccine (1 of 2) Never done   Mammogram  07/17/2022   COVID-19 Vaccine (3 - 2024-25 season) 09/29/2022   Screening for Lung Cancer  04/30/2023   DEXA scan (bone density measurement)  07/17/2023   Flu Shot  08/29/2023   DTaP/Tdap/Td vaccine (2 - Td or Tdap) 04/17/2024   Medicare Annual Wellness Visit  06/03/2024   Pneumonia Vaccine  Completed   Hepatitis C Screening  Completed   HPV Vaccine  Aged Out   Meningitis B Vaccine  Aged Out   Colon Cancer Screening  Discontinued    Advanced directives: (ACP Link)Information on Advanced Care Planning can be found at [place]  Secretary of [facility] Advance Health Care Directives Advance Health Care Directives. [email or web address] You may also get these forms at your doctor's office.  Once you have completed the forms, please bring a copy of your health care power of attorney and living will to the office to be added to your chart at your convenience.   Next Medicare Annual Wellness Visit scheduled for next year: Yes, 06/09/24 @ 2:40pm  (Phone Visit)  Have you seen your provider in the last 6 months (3 months if uncontrolled diabetes)? No, Appt scheduled for 06/09/23 @ 2:40pm.  Fall Prevention in the Home, Adult Falls can cause injuries and  affect people of all ages. There are many simple things that you can do to make your home safe and to help prevent falls. If you need it, ask for help making these changes. What actions can I take to prevent falls? General information Use good lighting in all rooms. Make sure to: Replace any light bulbs that burn out. Turn on lights if it is dark and use night-lights. Keep items that you use often in easy-to-reach places. Lower the shelves around your home if needed. Move furniture so that there are clear paths around it. Do not keep throw rugs or other things on the floor that can make you trip. If any of your floors are uneven, fix them. Add color or contrast paint or tape to clearly mark and help you see: Grab bars or handrails. First and last steps of staircases. Where the edge of each step is. If you use a ladder or stepladder: Make sure that it is fully opened. Do not climb a closed ladder. Make sure the sides of the ladder are locked in place. Have someone hold the ladder while you use it. Know where your pets are as you move through your home. What can I do in the bathroom?     Keep the floor dry. Clean up any water that is on the floor right away. Remove soap buildup in the bathtub or shower. Buildup makes bathtubs and showers slippery. Use non-skid mats or decals on the floor of the  bathtub or shower. Attach bath mats securely with double-sided, non-slip rug tape. If you need to sit down while you are in the shower, use a non-slip stool. Install grab bars by the toilet and in the bathtub and shower. Do not use towel bars as grab bars. What can I do in the bedroom? Make sure that you have a light by your bed that is easy to reach. Do not use any sheets or blankets on your bed that hang to the floor. Have a firm bench or chair with side arms that you can use for support when you get dressed. What can I do in the kitchen? Clean up any spills right away. If you need to reach  something above you, use a sturdy step stool that has a grab bar. Keep electrical cables out of the way. Do not use floor polish or wax that makes floors slippery. What can I do with my stairs? Do not leave anything on the stairs. Make sure that you have a light switch at the top and the bottom of the stairs. Have them installed if you do not have them. Make sure that there are handrails on both sides of the stairs. Fix handrails that are broken or loose. Make sure that handrails are as long as the staircases. Install non-slip stair treads on all stairs in your home if they do not have carpet. Avoid having throw rugs at the top or bottom of stairs, or secure the rugs with carpet tape to prevent them from moving. Choose a carpet design that does not hide the edge of steps on the stairs. Make sure that carpet is firmly attached to the stairs. Fix any carpet that is loose or worn. What can I do on the outside of my home? Use bright outdoor lighting. Repair the edges of walkways and driveways and fix any cracks. Clear paths of anything that can make you trip, such as tools or rocks. Add color or contrast paint or tape to clearly mark and help you see high doorway thresholds. Trim any bushes or trees on the main path into your home. Check that handrails are securely fastened and in good repair. Both sides of all steps should have handrails. Install guardrails along the edges of any raised decks or porches. Have leaves, snow, and ice cleared regularly. Use sand, salt, or ice melt on walkways during winter months if you live where there is ice and snow. In the garage, clean up any spills right away, including grease or oil spills. What other actions can I take? Review your medicines with your health care provider. Some medicines can make you confused or feel dizzy. This can increase your chance of falling. Wear closed-toe shoes that fit well and support your feet. Wear shoes that have rubber soles and  low heels. Use a cane, walker, scooter, or crutches that help you move around if needed. Talk with your provider about other ways that you can decrease your risk of falls. This may include seeing a physical therapist to learn to do exercises to improve movement and strength. Where to find more information Centers for Disease Control and Prevention, [employer]: TonerPromos.no [employer] on Aging: BaseRingTones.pl National Institute on Aging: BaseRingTones.pl Contact a health care provider if: You are afraid of falling at home. You feel weak, drowsy, or dizzy at home. You fall at home. Get help right away if you: Lose consciousness or have trouble moving after a fall. Have a fall that causes a head injury.  These symptoms may be an emergency. Get help right away. Call 911. Do not wait to see if the symptoms will go away. Do not drive yourself to the hospital. This information is not intended to replace advice given to you by your health care provider. Make sure you discuss any questions you have with your health care provider. Document Revised: 09/17/2021 Document Reviewed: 09/17/2021 Elsevier Patient Education  2024 [employer].

## 2023-06-04 NOTE — Progress Notes (Signed)
 Subjective:   Lori Lambert is a 78 y.o. who presents for a Medicare Wellness preventive visit.  Visit Complete: Virtual I connected with  Fredie Jarvis on 06/04/23 by a audio enabled telemedicine application and verified that I am speaking with the correct person using two identifiers.  Patient Location: Home  Provider Location: Home Office  I discussed the limitations of evaluation and management by telemedicine. The patient expressed understanding and agreed to proceed.  Vital Signs: Because this visit was a virtual/telehealth visit, some criteria may be missing or patient reported. Any vitals not documented were not able to be obtained and vitals that have been documented are patient reported.  VideoDeclined- This patient declined Librarian, academic. Therefore the visit was completed with audio only.  Persons Participating in Visit: Patient.  AWV Questionnaire: No: Patient Medicare AWV questionnaire was not completed prior to this visit.  Cardiac Risk Factors include: advanced age (>59men, >84 women);Other (see comment), Risk factor comments: prediabetic     Objective:    Today's Vitals   06/04/23 1354 06/04/23 1355  Weight: 121 lb (54.9 kg)   Height: 5\' 2"  (1.575 m)   PainSc:  6    Body mass index is 22.13 kg/m.     06/04/2023    2:13 PM 05/02/2023   12:24 PM 05/29/2022    3:08 PM 02/26/2022    6:26 AM 02/14/2022    9:53 AM 06/29/2021   11:44 AM 05/23/2021    3:02 PM  Advanced Directives  Does Patient Have a Medical Advance Directive? No No No No No No No  Would patient like information on creating a medical advance directive? Yes (MAU/Ambulatory/Procedural Areas - Information given)  No - Patient declined No - Patient declined   Yes (MAU/Ambulatory/Procedural Areas - Information given)    Current Medications (verified) Outpatient Encounter Medications as of 06/04/2023  Medication Sig   acetaminophen  (TYLENOL ) 325 MG tablet Take 650  mg by mouth every 6 (six) hours as needed.   albuterol  (VENTOLIN  HFA) 108 (90 Base) MCG/ACT inhaler Inhale 2 puffs into the lungs every 4 (four) hours as needed.   alendronate  (FOSAMAX ) 70 MG tablet TAKE 1 TABLET EVERY 7 DAYS WITH A FULL GLASS OF WATER ON AN EMPTY STOMACH   atorvastatin  (LIPITOR) 10 MG tablet TAKE 1 TABLET EVERY DAY   Cholecalciferol (VITAMIN D3) 5000 units TABS Take 1 tablet by mouth daily.   clotrimazole -betamethasone  (LOTRISONE ) cream Apply 1 Application topically daily.   methocarbamol  (ROBAXIN ) 500 MG tablet Take 1 tablet (500 mg total) by mouth every 6 (six) hours as needed for muscle spasms.   nystatin  (MYCOSTATIN ) 100000 UNIT/ML suspension Use as directed 200,000 Units in the mouth or throat 4 (four) times daily. Swish and swallow   SYMBICORT  160-4.5 MCG/ACT inhaler INHALE 2 PUFFS TWICE DAILY   No facility-administered encounter medications on file as of 06/04/2023.    Allergies (verified) Sulfa antibiotics   History: Past Medical History:  Diagnosis Date   Aortic atherosclerosis (HCC)    Closed fracture of right proximal humerus 03/24/2019   COPD (chronic obstructive pulmonary disease) (HCC)    Family history of adverse reaction to anesthesia    sister has N/V   Hypercholesterolemia    Osteoporosis    Pneumonia    Pre-diabetes    Past Surgical History:  Procedure Laterality Date   ABDOMINAL HYSTERECTOMY  1976   bladder tack  2005   COLONOSCOPY     ORIF FOREARM FRACTURE Right 2011  wrist   REVERSE SHOULDER ARTHROPLASTY Right 02/26/2022   Procedure: REVERSE SHOULDER ARTHROPLASTY;  Surgeon: Elner Hahn, MD;  Location: [facility] ORS;  Service: Orthopedics;  Laterality: Right;   Family History  Problem Relation Age of Onset   Heart attack Mother    Heart attack Father    Renal cancer Sister    Social History   Socioeconomic History   Marital status: Divorced    Spouse name: Not on file   Number of children: 2   Years of education: Not on file    Highest education level: Not on file  Occupational History   Not on file  Tobacco Use   Smoking status: Former    Current packs/day: 0.00    Average packs/day: 1 pack/day for 40.0 years (40.0 ttl pk-yrs)    Types: Cigarettes    Start date: 07/13/1974    Quit date: 07/13/2014    Years since quitting: 8.8   Smokeless tobacco: Never  Vaping Use   Vaping status: Never Used  Substance and Sexual Activity   Alcohol use: No   Drug use: No   Sexual activity: Not on file  Other Topics Concern   Not on file  Social History Narrative   Pt's daughter lives with her.    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, environmental   Social Drivers of Health   Financial Resource Strain: Low Risk  (06/04/2023)   Overall Financial Resource Strain (CARDIA)    Difficulty of Paying Living Expenses: Not hard at all  Food Insecurity: No Food Insecurity (06/04/2023)   Hunger Vital Sign    Worried About Running Out of Food in the Last Year: Never true    Ran Out of Food in the Last Year: Never true  Transportation Needs: No Transportation Needs (06/04/2023)   PRAPARE - Administrator, Civil Service (Medical): No    Lack of Transportation (Non-Medical): No  Physical Activity: Inactive (06/04/2023)   Exercise Vital Sign    Days of Exercise per Week: 0 days    Minutes of Exercise per Session: 0 min  Stress: No Stress Concern Present (06/04/2023)   [employer] of Occupational Health - Occupational Stress Questionnaire    Feeling of Stress : Not at all  Social Connections: Moderately Isolated (06/04/2023)   Social Connection and Isolation Panel [NHANES]    Frequency of Communication with Friends and Family: More than three times a week    Frequency of Social Gatherings with Friends and Family: More than three times a week    Attends Religious Services: More than 4 times per year    Active Member of [employer] or Organizations: No    Attends Engineer, structural: Never    Marital Status: Divorced    Tobacco  Counseling Counseling given: Not Answered    Clinical Intake:  Pre-visit preparation completed: Yes  Pain : 0-10 Pain Score: 6  Pain Type: Chronic pain Pain Location: Back Pain Descriptors / Indicators: Aching     BMI - recorded: 22.13 Nutritional Status: BMI of 19-24  Normal Nutritional Risks: None Diabetes: No  Lab Results  Component Value Date   HGBA1C 6.1 (H) 04/05/2021   HGBA1C 6.0 (H) 04/20/2020   HGBA1C 5.7 (H) 04/20/2019     How often do you need to have someone help you when you read instructions, pamphlets, or other written materials from your doctor or pharmacy?: 1 - Never  Interpreter Needed?: No  Information entered by :: Jaunita Messier, CMA   Activities  of Daily Living     06/04/2023    2:03 PM  In your present state of health, do you have any difficulty performing the following activities:  Hearing? 0  Vision? 0  Difficulty concentrating or making decisions? 0  Walking or climbing stairs? 1  Comment gets sob due to COPD  Dressing or bathing? 0  Doing errands, shopping? 0  Preparing Food and eating ? N  Using the Toilet? N  In the past six months, have you accidently leaked urine? N  Do you have problems with loss of bowel control? N  Managing your Medications? N  Managing your Finances? N  Housekeeping or managing your Housekeeping? N    Patient Care Team: Sheron Dixons, MD as PCP - General (Internal Medicine)  Indicate any recent Medical Services you may have received from other than [facility] providers in the past year (date may be approximate).     Assessment:   This is a routine wellness examination for Lori Lambert.  Hearing/Vision screen Hearing Screening - Comments:: Denies hearing loss Vision Screening - Comments:: Needs eye exam, Patient will call Dr. Spencer Dy @ [facility] [place] to make an appointment   Goals Addressed             This Visit's Progress    Patient Stated       Don't get any broken bones       Depression  Screen     06/04/2023    2:11 PM 06/04/2022    1:27 PM 05/29/2022    3:07 PM 07/17/2021    1:19 PM 05/23/2021    3:01 PM 04/05/2021   10:30 AM 09/06/2020   10:47 AM  PHQ 2/9 Scores  PHQ - 2 Score 0 0 2 0 0 0 0  PHQ- 9 Score 0 6 3 5 1 3 2     Fall Risk     06/04/2023    2:15 PM 06/04/2022    1:27 PM 05/29/2022    3:09 PM 07/17/2021    1:19 PM 05/23/2021    3:03 PM  Fall Risk   Falls in the past year? 1 1 0 0 0  Number falls in past yr: 0 0 0 0 0  Injury with Fall? 1 0 0 0 0  Risk for fall due to : History of fall(s);Impaired balance/gait;Orthopedic patient No Fall Risks No Fall Risks No Fall Risks No Fall Risks  Follow up Falls prevention discussed;Falls evaluation completed;Education provided Falls evaluation completed Falls prevention discussed Falls evaluation completed Falls prevention discussed    MEDICARE RISK AT HOME:  Medicare Risk at Home Any stairs in or around the home?: Yes If so, are there any without handrails?: No Home free of loose throw rugs in walkways, pet beds, electrical cords, etc?: Yes Adequate lighting in your home to reduce risk of falls?: Yes Life alert?: No Use of a cane, walker or w/c?: No Grab bars in the bathroom?: Yes Shower chair or bench in shower?: Yes Elevated toilet seat or a handicapped toilet?: No  TIMED UP AND GO:  Was the test performed?  No  Cognitive Function: 6CIT completed        06/04/2023    2:17 PM 05/29/2022    3:17 PM  6CIT Screen  What Year? 0 points 0 points  What month? 0 points 0 points  What time? 0 points 0 points  Count back from 20 0 points 0 points  Months in reverse 0 points 0 points  Repeat phrase  0 points 4 points  Total Score 0 points 4 points    Immunizations Immunization History  Administered Date(s) Administered   Fluad Quad(high Dose 65+) 11/23/2020, 12/04/2021   Fluad Trivalent(High Dose 65+) 11/20/2022   Influenza, High Dose Seasonal PF 10/20/2017   Influenza, Seasonal, Injecte, Preservative Fre  01/03/2009, 02/27/2011, 10/28/2012   Influenza-Unspecified 10/29/2018, 10/29/2019   Moderna Sars-Covid-2 Vaccination 08/20/2019, 09/17/2019   Pneumococcal Conjugate-13 02/21/2016   Pneumococcal Polysaccharide-23 02/01/2011   Tdap 04/18/2014    Screening Tests Health Maintenance  Topic Date Due   Zoster Vaccines- Shingrix (1 of 2) Never done   MAMMOGRAM  07/17/2022   COVID-19 Vaccine (3 - 2024-25 season) 09/29/2022   Lung Cancer Screening  04/30/2023   DEXA SCAN  07/17/2023   INFLUENZA VACCINE  08/29/2023   DTaP/Tdap/Td (2 - Td or Tdap) 04/17/2024   Medicare Annual Wellness (AWV)  06/03/2024   Pneumonia Vaccine 19+ Years old  Completed   Hepatitis C Screening  Completed   HPV VACCINES  Aged Out   Meningococcal B Vaccine  Aged Out   Colonoscopy  Discontinued    Health Maintenance  Health Maintenance Due  Topic Date Due   Zoster Vaccines- Shingrix (1 of 2) Never done   MAMMOGRAM  07/17/2022   COVID-19 Vaccine (3 - 2024-25 season) 09/29/2022   Lung Cancer Screening  04/30/2023   Health Maintenance Items Addressed: Mammogram ordered, DEXA ordered, See Nurse Notes  Additional Screening:  Vision Screening: Recommended annual ophthalmology exams for early detection of glaucoma and other disorders of the eye.  Dental Screening: Recommended annual dental exams for proper oral hygiene  Community Resource Referral / Chronic Care Management: CRR required this visit?  No   CCM required this visit?  No     Plan:     I have personally reviewed and noted the following in the patient's chart:   Medical and social history Use of alcohol, tobacco or illicit drugs  Current medications and supplements including opioid prescriptions. Patient is not currently taking opioid prescriptions. Functional ability and status Nutritional status Physical activity Advanced directives List of other physicians Hospitalizations, surgeries, and ER visits in previous 12  months Vitals Screenings to include cognitive, depression, and falls Referrals and appointments  In addition, I have reviewed and discussed with patient certain preventive protocols, quality metrics, and best practice recommendations. A written personalized care plan for preventive services as well as general preventive health recommendations were provided to patient.     Jaunita Messier, CMA   06/04/2023   After Visit Summary: (MyChart) Due to this being a telephonic visit, the after visit summary with patients personalized plan was offered to patient via MyChart   Notes: Please refer to Routing Comments.

## 2023-06-09 ENCOUNTER — Ambulatory Visit (INDEPENDENT_AMBULATORY_CARE_PROVIDER_SITE_OTHER): Admitting: Internal Medicine

## 2023-06-09 ENCOUNTER — Encounter: Payer: Self-pay | Admitting: Internal Medicine

## 2023-06-09 VITALS — BP 126/82 | HR 89 | Ht 62.0 in | Wt 123.2 lb

## 2023-06-09 DIAGNOSIS — R7303 Prediabetes: Secondary | ICD-10-CM | POA: Diagnosis not present

## 2023-06-09 DIAGNOSIS — M810 Age-related osteoporosis without current pathological fracture: Secondary | ICD-10-CM | POA: Diagnosis not present

## 2023-06-09 DIAGNOSIS — E559 Vitamin D deficiency, unspecified: Secondary | ICD-10-CM

## 2023-06-09 DIAGNOSIS — I700 Atherosclerosis of aorta: Secondary | ICD-10-CM | POA: Diagnosis not present

## 2023-06-09 DIAGNOSIS — M62830 Muscle spasm of back: Secondary | ICD-10-CM | POA: Diagnosis not present

## 2023-06-09 DIAGNOSIS — J432 Centrilobular emphysema: Secondary | ICD-10-CM

## 2023-06-09 MED ORDER — METHOCARBAMOL 500 MG PO TABS: 500.0000 mg | ORAL_TABLET | Freq: Four times a day (QID) | ORAL | 0 refills | Status: DC | PRN

## 2023-06-09 NOTE — Assessment & Plan Note (Signed)
 Controlled on Symbicort  No recent URI and flares of COPD Recent LDCT screening done

## 2023-06-09 NOTE — Assessment & Plan Note (Signed)
 Hx of vitamin D  def Now on daily supplement; last level normal

## 2023-06-09 NOTE — Assessment & Plan Note (Signed)
 Recent wrist fracture On alendronate  with calcium  and vitamin D  Schedule DEXA

## 2023-06-09 NOTE — Progress Notes (Signed)
 Date:  06/09/2023   Name:  Lori Lambert   DOB:  10-19-1945   MRN:  [id number]   Chief Complaint: Prediabetes  Hyperlipidemia This is a chronic problem. The problem is controlled. Pertinent negatives include no chest pain, focal sensory loss or shortness of breath. Current antihyperlipidemic treatment includes statins. The current treatment provides significant improvement of lipids.  Diabetes She presents for her follow-up diabetic visit. Diabetes type: prediabetes. Her disease course has been stable. Pertinent negatives for hypoglycemia include no dizziness, headaches or nervousness/anxiousness. Pertinent negatives for diabetes include no chest pain and no fatigue. Symptoms are stable. Current diabetic treatment includes diet. She is compliant with treatment some of the time. An ACE inhibitor/angiotensin II receptor blocker is not being taken.    Review of Systems  Constitutional:  Negative for chills, fatigue and fever.  Respiratory:  Negative for chest tightness and shortness of breath.   Cardiovascular:  Negative for chest pain and palpitations.  Gastrointestinal:  Negative for abdominal pain, constipation and diarrhea.  Genitourinary:  Negative for dysuria.  Musculoskeletal:  Positive for arthralgias (right wrist fracture - healing).  Neurological:  Negative for dizziness and headaches.  Psychiatric/Behavioral:  Negative for dysphoric mood and sleep disturbance. The patient is not nervous/anxious.      Lab Results  Component Value Date   NA 142 02/14/2022   K 3.4 (L) 02/14/2022   CO2 30 02/14/2022   GLUCOSE 130 (H) 02/14/2022   BUN 19 02/14/2022   CREATININE 0.86 02/14/2022   CALCIUM  9.1 02/14/2022   EGFR 76 04/05/2021   GFRNONAA >60 02/14/2022   Lab Results  Component Value Date   CHOL 169 04/05/2021   HDL 59 04/05/2021   LDLCALC 90 04/05/2021   TRIG 109 04/05/2021   CHOLHDL 2.9 04/05/2021   Lab Results  Component Value Date   TSH 2.870 04/05/2021   Lab  Results  Component Value Date   HGBA1C 6.1 (H) 04/05/2021   Lab Results  Component Value Date   WBC 8.8 02/14/2022   HGB 13.6 02/14/2022   HCT 40.9 02/14/2022   MCV 95.3 02/14/2022   PLT 319 02/14/2022   Lab Results  Component Value Date   ALT 15 02/14/2022   AST 20 02/14/2022   ALKPHOS 53 02/14/2022   BILITOT 0.7 02/14/2022   Lab Results  Component Value Date   VD25OH 38.7 04/05/2021     Patient Active Problem List   Diagnosis Date Noted   Age-related osteoporosis without current pathological fracture 04/20/2020   Pulmonary emphysema (HCC) 04/20/2020   Aortic atherosclerosis (HCC) 04/20/2020   Proximal humerus fracture 04/20/2019   Bilateral renal cysts 03/05/2019   Prediabetes 10/20/2017   Tobacco use disorder, moderate, in sustained remission 10/20/2017   Allergic rhinitis 04/17/2017   Vitamin D  deficiency 02/22/2016   Family history of cancer of the kidney 05/12/2014   Arthritis of knee, degenerative 05/12/2014   Sleep disorder 06/28/2013    Allergies  Allergen Reactions   Sulfa Antibiotics Rash    Past Surgical History:  Procedure Laterality Date   ABDOMINAL HYSTERECTOMY  1976   bladder tack  2005   COLONOSCOPY     ORIF FOREARM FRACTURE Right 2011   wrist   REVERSE SHOULDER ARTHROPLASTY Right 02/26/2022   Procedure: REVERSE SHOULDER ARTHROPLASTY;  Surgeon: Elner Hahn, MD;  Location: [facility] ORS;  Service: Orthopedics;  Laterality: Right;    Social History   Tobacco Use   Smoking status: Former    Current packs/day: 0.00  Average packs/day: 1 pack/day for 40.0 years (40.0 ttl pk-yrs)    Types: Cigarettes    Start date: 07/13/1974    Quit date: 07/13/2014    Years since quitting: 8.9   Smokeless tobacco: Never  Vaping Use   Vaping status: Never Used  Substance Use Topics   Alcohol use: No   Drug use: No     Medication list has been reviewed and updated.  Current Meds  Medication Sig   acetaminophen  (TYLENOL ) 325 MG tablet Take 650 mg by  mouth every 6 (six) hours as needed.   albuterol  (VENTOLIN  HFA) 108 (90 Base) MCG/ACT inhaler Inhale 2 puffs into the lungs every 4 (four) hours as needed.   alendronate  (FOSAMAX ) 70 MG tablet TAKE 1 TABLET EVERY 7 DAYS WITH A FULL GLASS OF WATER ON AN EMPTY STOMACH   atorvastatin  (LIPITOR) 10 MG tablet TAKE 1 TABLET EVERY DAY   Cholecalciferol (VITAMIN D3) 5000 units TABS Take 1 tablet by mouth daily.   clotrimazole -betamethasone  (LOTRISONE ) cream Apply 1 Application topically daily.   nystatin  (MYCOSTATIN ) 100000 UNIT/ML suspension Use as directed 200,000 Units in the mouth or throat 4 (four) times daily. Swish and swallow   SYMBICORT  160-4.5 MCG/ACT inhaler INHALE 2 PUFFS TWICE DAILY   [DISCONTINUED] methocarbamol  (ROBAXIN ) 500 MG tablet Take 1 tablet (500 mg total) by mouth every 6 (six) hours as needed for muscle spasms.       06/09/2023    2:42 PM 06/04/2022    1:27 PM 07/17/2021    1:19 PM 04/05/2021   10:30 AM  GAD 7 : Generalized Anxiety Score  Nervous, Anxious, on Edge 0 0 0 0  Control/stop worrying 0 0 0 0  Worry too much - different things 0 0 0 0  Trouble relaxing 0 0 0 0  Restless 0 0 0 0  Easily annoyed or irritable 0 0 0 0  Afraid - awful might happen 0 0 0 0  Total GAD 7 Score 0 0 0 0  Anxiety Difficulty Not difficult at all Not difficult at all Not difficult at all Not difficult at all       06/09/2023    2:42 PM 06/04/2023    2:11 PM 06/04/2022    1:27 PM  Depression screen PHQ 2/9  Decreased Interest 0 0 0  Down, Depressed, Hopeless 0 0 0  PHQ - 2 Score 0 0 0  Altered sleeping 0 0 0  Tired, decreased energy 1 0 2  Change in appetite 0 0 3  Feeling bad or failure about yourself  0 0 1  Trouble concentrating 0 0 0  Moving slowly or fidgety/restless 0 0 0  Suicidal thoughts 0 0 0  PHQ-9 Score 1 0 6  Difficult doing work/chores Not difficult at all Not difficult at all Not difficult at all    BP Readings from Last 3 Encounters:  06/09/23 126/82  05/02/23 125/77   06/04/22 132/78    Physical Exam Vitals and nursing note reviewed.  Constitutional:      General: She is not in acute distress.    Appearance: She is well-developed.  HENT:     Head: Normocephalic and atraumatic.  Neck:     Vascular: No carotid bruit.  Cardiovascular:     Rate and Rhythm: Normal rate and regular rhythm.  Pulmonary:     Effort: Pulmonary effort is normal. No respiratory distress.     Breath sounds: No wheezing or rhonchi.  Abdominal:     General:  Abdomen is flat.     Palpations: Abdomen is soft.  Musculoskeletal:     Right wrist: Swelling and bony tenderness present.     Cervical back: Normal range of motion.     Right lower leg: No edema.     Left lower leg: No edema.  Lymphadenopathy:     Cervical: No cervical adenopathy.  Skin:    General: Skin is warm and dry.     Findings: No rash.  Neurological:     Mental Status: She is alert and oriented to person, place, and time.  Psychiatric:        Attention and Perception: Attention normal.        Mood and Affect: Mood normal.        Behavior: Behavior normal.     Wt Readings from Last 3 Encounters:  06/09/23 123 lb 4 oz (55.9 kg)  06/04/23 121 lb (54.9 kg)  05/02/23 119 lb 0.8 oz (54 kg)    BP 126/82   Pulse 89   Ht 5\' 2"  (1.575 m)   Wt 123 lb 4 oz (55.9 kg)   SpO2 95%   BMI 22.54 kg/m   Assessment and Plan:  Problem List Items Addressed This Visit       Unprioritized   Vitamin D  deficiency (Chronic)   Hx of vitamin D  def Now on daily supplement; last level normal      Prediabetes (Chronic)   Continue to work on diet changes Will recheck labs and advise.      Relevant Orders   CBC with Differential/Platelet   Comprehensive metabolic panel with GFR   Hemoglobin A1c   Age-related osteoporosis without current pathological fracture (Chronic)   Recent wrist fracture On alendronate  with calcium  and vitamin D  Schedule DEXA      Pulmonary emphysema (HCC) (Chronic)   Controlled on  Symbicort  No recent URI and flares of COPD Recent LDCT screening done      Aortic atherosclerosis (HCC) - Primary (Chronic)   LDL is  Lab Results  Component Value Date   LDLCALC 90 04/05/2021   Current regimen is lipitor 10 mg.  No medication side effects noted. Goal LDL is <70.       Relevant Orders   Lipid panel   Other Visit Diagnoses       Spasm of thoracic back muscle       continue Aleve and add heat   Relevant Medications   methocarbamol  (ROBAXIN ) 500 MG tablet      Patient to schedule DEXA and mammogram.  Return in about 6 months (around 12/10/2023) for DM, lipids.    Sheron Dixons, MD [facility] [place]

## 2023-06-09 NOTE — Patient Instructions (Signed)
Call [facility] to schedule your mammogram and Bone Density in [place] at [phone].

## 2023-06-09 NOTE — Assessment & Plan Note (Signed)
 LDL is  Lab Results  Component Value Date   LDLCALC 90 04/05/2021   Current regimen is lipitor 10 mg.  No medication side effects noted. Goal LDL is <70.

## 2023-06-09 NOTE — Assessment & Plan Note (Signed)
 Continue to work on diet changes Will recheck labs and advise.

## 2023-06-10 ENCOUNTER — Ambulatory Visit: Payer: Self-pay | Admitting: Internal Medicine

## 2023-06-10 LAB — LIPID PANEL
Chol/HDL Ratio: 3.2 ratio (ref 0.0–4.4)
Cholesterol, Total: 147 mg/dL (ref 100–199)
HDL: 46 mg/dL (ref >39)
LDL Chol Calc (NIH): 60 mg/dL (ref 0–99)
Triglycerides: 255 mg/dL — ABNORMAL HIGH (ref 0–149)
VLDL Cholesterol Cal: 41 mg/dL — ABNORMAL HIGH (ref 5–40)

## 2023-06-10 LAB — HEMOGLOBIN A1C
Est. average glucose Bld gHb Est-mCnc: 123 mg/dL
Hgb A1c MFr Bld: 5.9 % — ABNORMAL HIGH (ref 4.8–5.6)

## 2023-06-10 LAB — CBC WITH DIFFERENTIAL/PLATELET
Basophils Absolute: 0.1 10*3/uL (ref 0.0–0.2)
Basos: 1 %
EOS (ABSOLUTE): 0.4 10*3/uL (ref 0.0–0.4)
Eos: 3 %
Hematocrit: 40.1 % (ref 34.0–46.6)
Hemoglobin: 13.4 g/dL (ref 11.1–15.9)
Immature Grans (Abs): 0 10*3/uL (ref 0.0–0.1)
Immature Granulocytes: 0 %
Lymphocytes Absolute: 3.4 10*3/uL — ABNORMAL HIGH (ref 0.7–3.1)
Lymphs: 32 %
MCH: 32.2 pg (ref 26.6–33.0)
MCHC: 33.4 g/dL (ref 31.5–35.7)
MCV: 96 fL (ref 79–97)
Monocytes Absolute: 0.7 10*3/uL (ref 0.1–0.9)
Monocytes: 7 %
Neutrophils Absolute: 6.3 10*3/uL (ref 1.4–7.0)
Neutrophils: 57 %
Platelets: 300 10*3/uL (ref 150–450)
RBC: 4.16 x10E6/uL (ref 3.77–5.28)
RDW: 12.8 % (ref 11.7–15.4)
WBC: 10.9 10*3/uL — ABNORMAL HIGH (ref 3.4–10.8)

## 2023-06-10 LAB — COMPREHENSIVE METABOLIC PANEL WITH GFR
ALT: 15 IU/L (ref 0–32)
AST: 19 IU/L (ref 0–40)
Albumin: 4.3 g/dL (ref 3.8–4.8)
Alkaline Phosphatase: 89 IU/L (ref 44–121)
BUN/Creatinine Ratio: 19 (ref 12–28)
BUN: 14 mg/dL (ref 8–27)
Bilirubin Total: <0.2 mg/dL (ref 0.0–1.2)
CO2: 26 mmol/L (ref 20–29)
Calcium: 10.2 mg/dL (ref 8.7–10.3)
Chloride: 101 mmol/L (ref 96–106)
Creatinine, Ser: 0.74 mg/dL (ref 0.57–1.00)
Globulin, Total: 2.5 g/dL (ref 1.5–4.5)
Glucose: 95 mg/dL (ref 70–99)
Potassium: 4.3 mmol/L (ref 3.5–5.2)
Sodium: 143 mmol/L (ref 134–144)
Total Protein: 6.8 g/dL (ref 6.0–8.5)
eGFR: 83 mL/min/{1.73_m2} (ref >59)

## 2023-07-30 ENCOUNTER — Other Ambulatory Visit: Payer: Self-pay | Admitting: Internal Medicine

## 2023-07-30 DIAGNOSIS — M810 Age-related osteoporosis without current pathological fracture: Secondary | ICD-10-CM

## 2023-08-01 NOTE — Telephone Encounter (Signed)
 Requested Prescriptions  Refused Prescriptions Disp Refills   alendronate  (FOSAMAX ) 70 MG tablet [Pharmacy Med Name: Alendronate  Sodium Oral Tablet 70 MG] 12 tablet 3    Sig: TAKE 1 TABLET EVERY 7 DAYS WITH A FULL GLASS OF WATER ON AN EMPTY STOMACH     Endocrinology:  Bisphosphonates Failed - 08/01/2023  4:58 PM      Failed - Vitamin D  in normal range and within 360 days    Vit D, 25-Hydroxy  Date Value Ref Range Status  04/05/2021 38.7 30.0 - 100.0 ng/mL Final    Comment:    Vitamin D  deficiency has been defined by the Institute of Medicine and an Endocrine Society practice guideline as a level of serum 25-OH vitamin D  less than 20 ng/mL (1,2). The Endocrine Society went on to further define vitamin D  insufficiency as a level between 21 and 29 ng/mL (2). 1. IOM (Institute of Medicine). 2010. Dietary reference    intakes for calcium  and D. [place]: The    [employer]. 2. Holick MF, Binkley Grundy, Bischoff-Ferrari HA, et al.    Evaluation, treatment, and prevention of vitamin D     deficiency: an Endocrine Society clinical practice    guideline. JCEM. 2011 Jul; 96(7):1911-30.          Failed - Mg Level in normal range and within 360 days    No results found for: MG       Failed - Phosphate in normal range and within 360 days    No results found for: PHOS       Failed - Bone Mineral Density or Dexa Scan completed in the last 2 years      Passed - Ca in normal range and within 360 days    Calcium   Date Value Ref Range Status  06/09/2023 10.2 8.7 - 10.3 mg/dL Final   Calcium , Total  Date Value Ref Range Status  01/31/2011 8.2 (L) 8.5 - 10.1 mg/dL Final         Passed - Cr in normal range and within 360 days    Creatinine  Date Value Ref Range Status  01/31/2011 0.64 0.60 - 1.30 mg/dL Final   Creatinine, Ser  Date Value Ref Range Status  06/09/2023 0.74 0.57 - 1.00 mg/dL Final         Passed - eGFR is 30 or above and within 360 days    EGFR (African  American)  Date Value Ref Range Status  01/31/2011 >60 >62mL/min Final   GFR calc Af Amer  Date Value Ref Range Status  04/20/2019 82 >59 mL/min/1.73 Final   EGFR (Non-African Amer.)  Date Value Ref Range Status  01/31/2011 >60 >67mL/min Final    Comment:    eGFR values <62mL/min/1.73 m2 may be an indication of chronic kidney disease (CKD). Calculated eGFR, using the MRDR Study equation, is useful in  patients with stable renal function. The eGFR calculation will not be reliable in acutely ill patients when serum creatinine is changing rapidly. It is not useful in patients on dialysis. The eGFR calculation may not be applicable to patients at the low and high extremes of body sizes, pregnant women, and vegetarians.    GFR, Estimated  Date Value Ref Range Status  02/14/2022 >60 >60 mL/min Final    Comment:    (NOTE) Calculated using the CKD-EPI Creatinine Equation (2021)    eGFR  Date Value Ref Range Status  06/09/2023 83 >59 mL/min/1.73 Final  Passed - Valid encounter within last 12 months    Recent Outpatient Visits           1 month ago Aortic atherosclerosis [facility])    Primary Care & Sports Medicine at [facility], Leita DEL, MD

## 2023-08-03 ENCOUNTER — Other Ambulatory Visit: Payer: Self-pay | Admitting: Internal Medicine

## 2023-08-03 DIAGNOSIS — M810 Age-related osteoporosis without current pathological fracture: Secondary | ICD-10-CM

## 2023-08-04 ENCOUNTER — Other Ambulatory Visit: Payer: Self-pay | Admitting: Internal Medicine

## 2023-08-04 DIAGNOSIS — M810 Age-related osteoporosis without current pathological fracture: Secondary | ICD-10-CM

## 2023-08-04 MED ORDER — ALENDRONATE SODIUM 70 MG PO TABS: 70.0000 mg | ORAL_TABLET | ORAL | 1 refills | Status: DC

## 2023-08-04 NOTE — Progress Notes (Unsigned)
 Date:  08/04/2023   Name:  Lori Lambert   DOB:  [birth year]   MRN:  [id number]   Chief Complaint: No chief complaint on file.  HPI  Review of Systems   Lab Results  Component Value Date   NA 143 06/09/2023   K 4.3 06/09/2023   CO2 26 06/09/2023   GLUCOSE 95 06/09/2023   BUN 14 06/09/2023   CREATININE 0.74 06/09/2023   CALCIUM  10.2 06/09/2023   EGFR 83 06/09/2023   GFRNONAA >60 02/14/2022   Lab Results  Component Value Date   CHOL 147 06/09/2023   HDL 46 06/09/2023   LDLCALC 60 06/09/2023   TRIG 255 (H) 06/09/2023   CHOLHDL 3.2 06/09/2023   Lab Results  Component Value Date   TSH 2.870 04/05/2021   Lab Results  Component Value Date   HGBA1C 5.9 (H) 06/09/2023   Lab Results  Component Value Date   WBC 10.9 (H) 06/09/2023   HGB 13.4 06/09/2023   HCT 40.1 06/09/2023   MCV 96 06/09/2023   PLT 300 06/09/2023   Lab Results  Component Value Date   ALT 15 06/09/2023   AST 19 06/09/2023   ALKPHOS 89 06/09/2023   BILITOT <0.2 06/09/2023   Lab Results  Component Value Date   VD25OH 38.7 04/05/2021     Patient Active Problem List   Diagnosis Date Noted   Age-related osteoporosis without current pathological fracture 04/20/2020   Pulmonary emphysema (HCC) 04/20/2020   Aortic atherosclerosis (HCC) 04/20/2020   Proximal humerus fracture 04/20/2019   Bilateral renal cysts 03/05/2019   Prediabetes 10/20/2017   Tobacco use disorder, moderate, in sustained remission 10/20/2017   Allergic rhinitis 04/17/2017   Vitamin D  deficiency 02/22/2016   Family history of cancer of the kidney 05/12/2014   Arthritis of knee, degenerative 05/12/2014   Sleep disorder 06/28/2013    Allergies  Allergen Reactions   Sulfa Antibiotics Rash    Past Surgical History:  Procedure Laterality Date   ABDOMINAL HYSTERECTOMY  1976   bladder tack  2005   COLONOSCOPY     ORIF FOREARM FRACTURE Right 2011   wrist   REVERSE SHOULDER ARTHROPLASTY Right 02/26/2022   Procedure:  REVERSE SHOULDER ARTHROPLASTY;  Surgeon: Edie Norleen PARAS, MD;  Location: [facility] ORS;  Service: Orthopedics;  Laterality: Right;    Social History   Tobacco Use   Smoking status: Former    Current packs/day: 0.00    Average packs/day: 1 pack/day for 40.0 years (40.0 ttl pk-yrs)    Types: Cigarettes    Start date: 07/13/1974    Quit date: 07/13/2014    Years since quitting: 9.0   Smokeless tobacco: Never  Vaping Use   Vaping status: Never Used  Substance Use Topics   Alcohol use: No   Drug use: No     Medication list has been reviewed and updated.  No outpatient medications have been marked as taking for the 08/04/23 encounter (Orders Only) with [name], MD.       06/09/2023    2:42 PM 06/04/2022    1:27 PM 07/17/2021    1:19 PM 04/05/2021   10:30 AM  GAD 7 : Generalized Anxiety Score  Nervous, Anxious, on Edge 0 0 0 0  Control/stop worrying 0 0 0 0  Worry too much - different things 0 0 0 0  Trouble relaxing 0 0 0 0  Restless 0 0 0 0  Easily annoyed or irritable 0 0 0 0  Afraid - awful might happen 0 0 0 0  Total GAD 7 Score 0 0 0 0  Anxiety Difficulty Not difficult at all Not difficult at all Not difficult at all Not difficult at all       06/09/2023    2:42 PM 06/04/2023    2:11 PM 06/04/2022    1:27 PM  Depression screen PHQ 2/9  Decreased Interest 0 0 0  Down, Depressed, Hopeless 0 0 0  PHQ - 2 Score 0 0 0  Altered sleeping 0 0 0  Tired, decreased energy 1 0 2  Change in appetite 0 0 3  Feeling bad or failure about yourself  0 0 1  Trouble concentrating 0 0 0  Moving slowly or fidgety/restless 0 0 0  Suicidal thoughts 0 0 0  PHQ-9 Score 1 0 6  Difficult doing work/chores Not difficult at all Not difficult at all Not difficult at all    BP Readings from Last 3 Encounters:  06/09/23 126/82  05/02/23 125/77  06/04/22 132/78    Physical Exam  Wt Readings from Last 3 Encounters:  06/09/23 123 lb 4 oz (55.9 kg)  06/04/23 121 lb (54.9 kg)  05/02/23 119  lb 0.8 oz (54 kg)    There were no vitals taken for this visit.  Assessment and Plan:  Problem List Items Addressed This Visit   None   No follow-ups on file.    Leita HILARIO Adie, MD [facility] [place]

## 2023-09-22 ENCOUNTER — Other Ambulatory Visit: Payer: Self-pay

## 2023-09-22 DIAGNOSIS — M62830 Muscle spasm of back: Secondary | ICD-10-CM

## 2023-09-22 MED ORDER — METHOCARBAMOL 500 MG PO TABS: 500.0000 mg | ORAL_TABLET | Freq: Four times a day (QID) | ORAL | 0 refills | Status: AC | PRN

## 2023-11-29 ENCOUNTER — Other Ambulatory Visit: Payer: Self-pay | Admitting: Internal Medicine

## 2023-11-29 DIAGNOSIS — I700 Atherosclerosis of aorta: Secondary | ICD-10-CM

## 2023-12-02 NOTE — Telephone Encounter (Signed)
 Requested Prescriptions  Pending Prescriptions Disp Refills   atorvastatin  (LIPITOR) 10 MG tablet [Pharmacy Med Name: ATORVASTATIN  CALCIUM  10 MG Oral Tablet] 90 tablet 1    Sig: TAKE 1 TABLET EVERY DAY     Cardiovascular:  Antilipid - Statins Failed - 12/02/2023 10:49 AM      Failed - Lipid Panel in normal range within the last 12 months    Cholesterol, Total  Date Value Ref Range Status  06/09/2023 147 100 - 199 mg/dL Final   LDL Chol Calc (NIH)  Date Value Ref Range Status  06/09/2023 60 0 - 99 mg/dL Final   HDL  Date Value Ref Range Status  06/09/2023 46 >39 mg/dL Final   Triglycerides  Date Value Ref Range Status  06/09/2023 255 (H) 0 - 149 mg/dL Final         Passed - Patient is not pregnant      Passed - Valid encounter within last 12 months    Recent Outpatient Visits           5 months ago Aortic atherosclerosis   [place] [facility] & [facility] Medicine at [facility], Leita DEL, MD

## 2023-12-03 ENCOUNTER — Ambulatory Visit (INDEPENDENT_AMBULATORY_CARE_PROVIDER_SITE_OTHER): Admitting: Internal Medicine

## 2023-12-03 ENCOUNTER — Encounter: Payer: Self-pay | Admitting: Internal Medicine

## 2023-12-03 VITALS — BP 112/68 | HR 94 | Ht 62.0 in | Wt 115.0 lb

## 2023-12-03 DIAGNOSIS — R7303 Prediabetes: Secondary | ICD-10-CM | POA: Diagnosis not present

## 2023-12-03 DIAGNOSIS — I700 Atherosclerosis of aorta: Secondary | ICD-10-CM

## 2023-12-03 DIAGNOSIS — J439 Emphysema, unspecified: Secondary | ICD-10-CM | POA: Diagnosis not present

## 2023-12-03 DIAGNOSIS — Z23 Encounter for immunization: Secondary | ICD-10-CM

## 2023-12-03 DIAGNOSIS — H6993 Unspecified Eustachian tube disorder, bilateral: Secondary | ICD-10-CM

## 2023-12-03 NOTE — Assessment & Plan Note (Signed)
 Symptoms stable on Symbicort . She has not had any recent chest infections or concerns

## 2023-12-03 NOTE — Progress Notes (Signed)
 Date:  12/03/2023   Name:  Lori Lambert   DOB:  [birth year]   MRN:  [id number]   Chief Complaint: Diabetes, Hyperlipidemia, and Ear Fullness (Left ear sounds like she is in a tunnel. She said she is having vertigo due to this x1 week.)  Diabetes She presents for her follow-up diabetic visit. Diabetes type: prediabetes. Her disease course has been stable. Pertinent negatives for hypoglycemia include no dizziness or headaches. Pertinent negatives for diabetes include no chest pain, no fatigue and no weakness.  Hyperlipidemia This is a chronic problem. The problem is controlled. Pertinent negatives include no chest pain, myalgias or shortness of breath. Current antihyperlipidemic treatment includes statins. The current treatment provides significant improvement of lipids.  Ear Fullness  Pertinent negatives include no abdominal pain, coughing, diarrhea, ear discharge, headaches or hearing loss.    Review of Systems  Constitutional:  Negative for fatigue and unexpected weight change.  HENT:  Positive for tinnitus. Negative for ear discharge, ear pain, hearing loss and trouble swallowing.   Eyes:  Negative for visual disturbance.  Respiratory:  Negative for cough, chest tightness, shortness of breath and wheezing.   Cardiovascular:  Negative for chest pain, palpitations and leg swelling.  Gastrointestinal:  Negative for abdominal pain, constipation and diarrhea.  Musculoskeletal:  Negative for arthralgias and myalgias.  Neurological:  Negative for dizziness, weakness, light-headedness and headaches.     Lab Results  Component Value Date   NA 143 06/09/2023   K 4.3 06/09/2023   CO2 26 06/09/2023   GLUCOSE 95 06/09/2023   BUN 14 06/09/2023   CREATININE 0.74 06/09/2023   CALCIUM  10.2 06/09/2023   EGFR 83 06/09/2023   GFRNONAA >60 02/14/2022   Lab Results  Component Value Date   CHOL 147 06/09/2023   HDL 46 06/09/2023   LDLCALC 60 06/09/2023   TRIG 255 (H) 06/09/2023    CHOLHDL 3.2 06/09/2023   Lab Results  Component Value Date   TSH 2.870 04/05/2021   Lab Results  Component Value Date   HGBA1C 5.9 (H) 06/09/2023   Lab Results  Component Value Date   WBC 10.9 (H) 06/09/2023   HGB 13.4 06/09/2023   HCT 40.1 06/09/2023   MCV 96 06/09/2023   PLT 300 06/09/2023   Lab Results  Component Value Date   ALT 15 06/09/2023   AST 19 06/09/2023   ALKPHOS 89 06/09/2023   BILITOT <0.2 06/09/2023   Lab Results  Component Value Date   VD25OH 38.7 04/05/2021     Patient Active Problem List   Diagnosis Date Noted   Age-related osteoporosis without current pathological fracture 04/20/2020   Pulmonary emphysema (HCC) 04/20/2020   Aortic atherosclerosis 04/20/2020   Proximal humerus fracture 04/20/2019   Bilateral renal cysts 03/05/2019   Prediabetes 10/20/2017   Tobacco use disorder, moderate, in sustained remission 10/20/2017   Allergic rhinitis 04/17/2017   Vitamin D  deficiency 02/22/2016   Family history of cancer of the kidney 05/12/2014   Arthritis of knee, degenerative 05/12/2014   Sleep disorder 06/28/2013    Allergies  Allergen Reactions   Sulfa Antibiotics Rash    Past Surgical History:  Procedure Laterality Date   ABDOMINAL HYSTERECTOMY  1976   bladder tack  2005   COLONOSCOPY     ORIF FOREARM FRACTURE Right 2011   wrist   REVERSE SHOULDER ARTHROPLASTY Right 02/26/2022   Procedure: REVERSE SHOULDER ARTHROPLASTY;  Surgeon: Edie Norleen PARAS, MD;  Location: [facility] ORS;  Service: Orthopedics;  Laterality: Right;  Social History   Tobacco Use   Smoking status: Former    Current packs/day: 0.00    Average packs/day: 1 pack/day for 40.0 years (40.0 ttl pk-yrs)    Types: Cigarettes    Start date: 07/13/1974    Quit date: 07/13/2014    Years since quitting: 9.3   Smokeless tobacco: Never  Vaping Use   Vaping status: Never Used  Substance Use Topics   Alcohol use: No   Drug use: No     Medication list has been reviewed and  updated.  Current Meds  Medication Sig   acetaminophen  (TYLENOL ) 325 MG tablet Take 650 mg by mouth every 6 (six) hours as needed.   albuterol  (VENTOLIN  HFA) 108 (90 Base) MCG/ACT inhaler Inhale 2 puffs into the lungs every 4 (four) hours as needed.   alendronate  (FOSAMAX ) 70 MG tablet Take 1 tablet (70 mg total) by mouth once a week. Take with a full glass of water on an empty stomach.   atorvastatin  (LIPITOR) 10 MG tablet TAKE 1 TABLET EVERY DAY   Cholecalciferol (VITAMIN D3) 5000 units TABS Take 1 tablet by mouth daily.   clotrimazole -betamethasone  (LOTRISONE ) cream Apply 1 Application topically daily.   methocarbamol  (ROBAXIN ) 500 MG tablet Take 1 tablet (500 mg total) by mouth every 6 (six) hours as needed for muscle spasms.   nystatin  (MYCOSTATIN ) 100000 UNIT/ML suspension Use as directed 200,000 Units in the mouth or throat 4 (four) times daily. Swish and swallow   SYMBICORT  160-4.5 MCG/ACT inhaler INHALE 2 PUFFS TWICE DAILY       12/03/2023    1:58 PM 06/09/2023    2:42 PM 06/04/2022    1:27 PM 07/17/2021    1:19 PM  GAD 7 : Generalized Anxiety Score  Nervous, Anxious, on Edge 0 0 0 0  Control/stop worrying 0 0 0 0  Worry too much - different things 0 0 0 0  Trouble relaxing 0 0 0 0  Restless 0 0 0 0  Easily annoyed or irritable 0 0 0 0  Afraid - awful might happen 0 0 0 0  Total GAD 7 Score 0 0 0 0  Anxiety Difficulty Not difficult at all Not difficult at all Not difficult at all Not difficult at all       12/03/2023    1:58 PM 06/09/2023    2:42 PM 06/04/2023    2:11 PM  Depression screen PHQ 2/9  Decreased Interest 0 0 0  Down, Depressed, Hopeless 0 0 0  PHQ - 2 Score 0 0 0  Altered sleeping 0 0 0  Tired, decreased energy 0 1 0  Change in appetite 0 0 0  Feeling bad or failure about yourself  0 0 0  Trouble concentrating 0 0 0  Moving slowly or fidgety/restless 0 0 0  Suicidal thoughts 0 0 0  PHQ-9 Score 0 1 0  Difficult doing work/chores Not difficult at all Not  difficult at all Not difficult at all    BP Readings from Last 3 Encounters:  12/03/23 112/68  06/09/23 126/82  05/02/23 125/77    Physical Exam Vitals and nursing note reviewed.  Constitutional:      General: She is not in acute distress.    Appearance: Normal appearance. She is well-developed.  HENT:     Head: Normocephalic and atraumatic.     Right Ear: Hearing normal. No tenderness. No middle ear effusion. Tympanic membrane is retracted. Tympanic membrane is not erythematous.  Left Ear: Hearing normal. No tenderness.  No middle ear effusion. Tympanic membrane is retracted. Tympanic membrane is not erythematous.     Nose:     Right Sinus: No maxillary sinus tenderness or frontal sinus tenderness.     Left Sinus: No maxillary sinus tenderness or frontal sinus tenderness.     Mouth/Throat:     Pharynx: Oropharynx is clear.  Cardiovascular:     Rate and Rhythm: Normal rate and regular rhythm.  Pulmonary:     Effort: Pulmonary effort is normal. No respiratory distress.     Breath sounds: Decreased breath sounds present. No wheezing or rhonchi.  Musculoskeletal:     Cervical back: Normal range of motion.  Lymphadenopathy:     Cervical: No cervical adenopathy.  Skin:    General: Skin is warm and dry.     Findings: No rash.  Neurological:     Mental Status: She is alert and oriented to person, place, and time.  Psychiatric:        Mood and Affect: Mood normal.        Behavior: Behavior normal.     Wt Readings from Last 3 Encounters:  12/03/23 115 lb (52.2 kg)  06/09/23 123 lb 4 oz (55.9 kg)  06/04/23 121 lb (54.9 kg)    BP 112/68   Pulse 94   Ht 5' 2 (1.575 m)   Wt 115 lb (52.2 kg)   SpO2 100%   BMI 21.03 kg/m   Assessment and Plan:  Problem List Items Addressed This Visit       Unprioritized   Prediabetes (Chronic)   Stable on diet control only. Lab Results  Component Value Date   HGBA1C 5.9 (H) 06/09/2023         Pulmonary emphysema (HCC)  (Chronic)   Symptoms stable on Symbicort . She has not had any recent chest infections or concerns      Aortic atherosclerosis (Chronic)   Doing well on statin therapy with atorvastatin  10 mg. Lab Results  Component Value Date   LDLCALC 60 06/09/2023         Other Visit Diagnoses       Dysfunction of both eustachian tubes    -  Primary   recommend Coricidin HBP bid if no improvement, consider steroid taper     Encounter for immunization       Relevant Orders   Flu vaccine HIGH DOSE PF(Fluzone Trivalent) (Completed)       Return in about 6 months (around 06/01/2024) for CPX  Dr. Lemon.    Leita HILARIO Adie, MD [facility] [place]

## 2023-12-03 NOTE — Assessment & Plan Note (Signed)
 Stable on diet control only. Lab Results  Component Value Date   HGBA1C 5.9 (H) 06/09/2023

## 2023-12-03 NOTE — Assessment & Plan Note (Signed)
 Doing well on statin therapy with atorvastatin  10 mg. Lab Results  Component Value Date   LDLCALC 60 06/09/2023

## 2023-12-03 NOTE — Patient Instructions (Addendum)
 Coricidin HBP - over the counter decongestant   Call [facility] to schedule your mammogram at [phone].

## 2024-02-17 ENCOUNTER — Other Ambulatory Visit: Payer: Self-pay

## 2024-02-17 DIAGNOSIS — M810 Age-related osteoporosis without current pathological fracture: Secondary | ICD-10-CM

## 2024-02-17 MED ORDER — ALENDRONATE SODIUM 70 MG PO TABS: 70.0000 mg | ORAL_TABLET | ORAL | 1 refills | Status: AC

## 2024-06-03 ENCOUNTER — Ambulatory Visit: Admitting: Student

## 2024-06-09 ENCOUNTER — Ambulatory Visit
